# Patient Record
Sex: Female | Born: 1950
Health system: Southern US, Community
[De-identification: ages and names within clinical notes are randomized; demographics above are authoritative.]

## PROBLEM LIST (undated history)

## (undated) DIAGNOSIS — M1990 Unspecified osteoarthritis, unspecified site: Secondary | ICD-10-CM

## (undated) DIAGNOSIS — G894 Chronic pain syndrome: Secondary | ICD-10-CM

## (undated) DIAGNOSIS — G2581 Restless legs syndrome: Secondary | ICD-10-CM

## (undated) DIAGNOSIS — G4730 Sleep apnea, unspecified: Secondary | ICD-10-CM

## (undated) DIAGNOSIS — J9610 Chronic respiratory failure, unspecified whether with hypoxia or hypercapnia: Secondary | ICD-10-CM

## (undated) DIAGNOSIS — D649 Anemia, unspecified: Secondary | ICD-10-CM

## (undated) DIAGNOSIS — M8580 Other specified disorders of bone density and structure, unspecified site: Secondary | ICD-10-CM

## (undated) DIAGNOSIS — M8700 Idiopathic aseptic necrosis of unspecified bone: Secondary | ICD-10-CM

## (undated) DIAGNOSIS — J45909 Unspecified asthma, uncomplicated: Secondary | ICD-10-CM

## (undated) DIAGNOSIS — S22080A Wedge compression fracture of T11-T12 vertebra, initial encounter for closed fracture: Secondary | ICD-10-CM

## (undated) DIAGNOSIS — I10 Essential (primary) hypertension: Secondary | ICD-10-CM

## (undated) DIAGNOSIS — F4310 Post-traumatic stress disorder, unspecified: Secondary | ICD-10-CM

## (undated) DIAGNOSIS — E039 Hypothyroidism, unspecified: Secondary | ICD-10-CM

## (undated) DIAGNOSIS — C801 Malignant (primary) neoplasm, unspecified: Secondary | ICD-10-CM

## (undated) HISTORY — PX: SPINAL CORD STIMULATOR IMPLANT: SHX2422

## (undated) HISTORY — PX: FRACTURE SURGERY: SHX138

## (undated) HISTORY — PX: ABDOMINAL HYSTERECTOMY: SHX81

## (undated) HISTORY — PX: TONSILLECTOMY: SUR1361

## (undated) HISTORY — PX: BLADDER REPAIR: SHX76

## (undated) HISTORY — PX: CATARACT EXTRACTION, BILATERAL: SHX1313

## (undated) HISTORY — PX: BREAST SURGERY: SHX581

## (undated) HISTORY — PX: VENOUS ABLATION: SHX2656

## (undated) HISTORY — PX: APPENDECTOMY: SHX54

---

## 2009-02-12 HISTORY — PX: ANKLE FUSION: SHX881

## 2013-08-15 HISTORY — PX: WRIST SURGERY: SHX841

## 2017-01-11 DIAGNOSIS — Z9689 Presence of other specified functional implants: Secondary | ICD-10-CM | POA: Insufficient documentation

## 2017-01-11 DIAGNOSIS — M8700 Idiopathic aseptic necrosis of unspecified bone: Secondary | ICD-10-CM | POA: Insufficient documentation

## 2017-01-11 DIAGNOSIS — S92902S Unspecified fracture of left foot, sequela: Secondary | ICD-10-CM | POA: Insufficient documentation

## 2017-01-11 DIAGNOSIS — M26629 Arthralgia of temporomandibular joint, unspecified side: Secondary | ICD-10-CM | POA: Insufficient documentation

## 2017-01-11 DIAGNOSIS — N6002 Solitary cyst of left breast: Secondary | ICD-10-CM | POA: Insufficient documentation

## 2017-01-11 DIAGNOSIS — T8543XA Leakage of breast prosthesis and implant, initial encounter: Secondary | ICD-10-CM | POA: Insufficient documentation

## 2017-01-11 DIAGNOSIS — F4310 Post-traumatic stress disorder, unspecified: Secondary | ICD-10-CM | POA: Diagnosis present

## 2017-01-11 DIAGNOSIS — M810 Age-related osteoporosis without current pathological fracture: Secondary | ICD-10-CM | POA: Insufficient documentation

## 2019-01-04 ENCOUNTER — Encounter (HOSPITAL_COMMUNITY): Payer: Self-pay | Admitting: Emergency Medicine

## 2019-01-04 ENCOUNTER — Emergency Department (HOSPITAL_COMMUNITY)
Admission: EM | Admit: 2019-01-04 | Discharge: 2019-01-04 | Disposition: A | Discharge status: Home / Self Care | Payer: Medicare PPO | Attending: Emergency Medicine | Admitting: Emergency Medicine

## 2019-01-04 ENCOUNTER — Other Ambulatory Visit: Payer: Self-pay

## 2019-01-04 ENCOUNTER — Emergency Department (HOSPITAL_COMMUNITY): Payer: Medicare PPO

## 2019-01-04 DIAGNOSIS — Y9389 Activity, other specified: Secondary | ICD-10-CM | POA: Insufficient documentation

## 2019-01-04 DIAGNOSIS — E876 Hypokalemia: Secondary | ICD-10-CM

## 2019-01-04 DIAGNOSIS — Y999 Unspecified external cause status: Secondary | ICD-10-CM | POA: Insufficient documentation

## 2019-01-04 DIAGNOSIS — Y929 Unspecified place or not applicable: Secondary | ICD-10-CM | POA: Insufficient documentation

## 2019-01-04 DIAGNOSIS — S22080A Wedge compression fracture of T11-T12 vertebra, initial encounter for closed fracture: Secondary | ICD-10-CM | POA: Insufficient documentation

## 2019-01-04 DIAGNOSIS — S299XXA Unspecified injury of thorax, initial encounter: Secondary | ICD-10-CM | POA: Diagnosis present

## 2019-01-04 DIAGNOSIS — X500XXA Overexertion from strenuous movement or load, initial encounter: Secondary | ICD-10-CM | POA: Diagnosis not present

## 2019-01-04 HISTORY — DX: Chronic respiratory failure, unspecified whether with hypoxia or hypercapnia: J96.10

## 2019-01-04 LAB — BASIC METABOLIC PANEL
Anion gap: 10 (ref 5–15)
BUN: 10 mg/dL (ref 8–23)
CALCIUM: 9.2 mg/dL (ref 8.9–10.3)
CO2: 24 mmol/L (ref 22–32)
Chloride: 106 mmol/L (ref 98–111)
Creatinine, Ser: 0.76 mg/dL (ref 0.44–1.00)
GFR calc Af Amer: >60 mL/min (ref >60)
GFR calc non Af Amer: >60 mL/min (ref >60)
Glucose, Bld: 186 mg/dL — ABNORMAL HIGH (ref 70–99)
Potassium: 3.1 mmol/L — ABNORMAL LOW (ref 3.5–5.1)
Sodium: 140 mmol/L (ref 135–145)

## 2019-01-04 LAB — URINALYSIS, ROUTINE W REFLEX MICROSCOPIC
Bacteria, UA: NONE SEEN
Bilirubin Urine: NEGATIVE
Glucose, UA: NEGATIVE mg/dL
Ketones, ur: NEGATIVE mg/dL
LEUKOCYTE UA: NEGATIVE
Nitrite: NEGATIVE
Protein, ur: NEGATIVE mg/dL
Specific Gravity, Urine: 1.004 — ABNORMAL LOW (ref 1.005–1.030)
pH: 6 (ref 5.0–8.0)

## 2019-01-04 MED ORDER — POTASSIUM CHLORIDE CRYS ER 20 MEQ PO TBCR: 20.0000 meq | EXTENDED_RELEASE_TABLET | Freq: Two times a day (BID) | ORAL | 0 refills | Status: DC

## 2019-01-04 MED ORDER — HYDROMORPHONE HCL 1 MG/ML IJ SOLN
1.0000 mg | Freq: Once | INTRAMUSCULAR | Status: AC
  Administered 2019-01-04: 1 mg via INTRAMUSCULAR
  Filled 2019-01-04: qty 1

## 2019-01-04 MED ORDER — HYDROMORPHONE HCL 1 MG/ML IJ SOLN
0.5000 mg | Freq: Once | INTRAMUSCULAR | Status: AC
  Administered 2019-01-04: 0.5 mg via INTRAVENOUS
  Filled 2019-01-04: qty 1

## 2019-01-04 MED ORDER — OXYCODONE-ACETAMINOPHEN 5-325 MG PO TABS: 2.0000 | ORAL_TABLET | ORAL | 0 refills | Status: DC | PRN

## 2019-01-04 MED ORDER — OXYCODONE-ACETAMINOPHEN 5-325 MG PO TABS: 1.0000 | ORAL_TABLET | ORAL | 0 refills | Status: DC | PRN

## 2019-01-04 MED ORDER — METHOCARBAMOL 500 MG PO TABS: 500.0000 mg | ORAL_TABLET | Freq: Three times a day (TID) | ORAL | 0 refills | Status: DC

## 2019-01-04 NOTE — ED Provider Notes (Signed)
[facility] EMERGENCY DEPARTMENT Provider Note   CSN: [id number] Arrival date & time: 01/04/19  1702    History   Chief Complaint Chief Complaint  Patient presents with  . Back Pain    HPI Melissa Burke is a 68 y.o. female.     HPI   Melissa Burke is a 68 y.o. female with a hx of chronic low back pain and has an implanted nerve stimulator device, presents to the Emergency Department complaining of increased low back pain after lifting a 40 pound horse saddle and then twisting to sit it down.  Incident occurred yesterday and since then , she has developed increasing pain across her lower back that she describes a constant and pain does not radiate into her buttocks or legs.  She states her stimulator is not controlling her pain.  She denies fall, pain, numbness or weakness in to her lower extremities, urine or bowel changes, fever, chills, and abdominal pain.  No recent travel or sick contacts.  She is also requesting referral info for pain management.     Past Medical History:  Diagnosis Date  . Chronic respiratory failure (HCC)     There are no active problems to display for this patient.   Past Surgical History:  Procedure Laterality Date  . ABDOMINAL HYSTERECTOMY    . BREAST SURGERY    . FRACTURE SURGERY       OB History    Gravida  3   Para  2   Term  2   Preterm      AB  1   Living        SAB  1   TAB      Ectopic      Multiple      Live Births               Home Medications    Prior to Admission medications   Not on File    Family History Family History  Problem Relation Age of Onset  . Emphysema Mother   . Cancer Sister   . Heart attack Other   . Colon cancer Other     Social History Social History   Tobacco Use  . Smoking status: Never Smoker  . Smokeless tobacco: Never Used  Substance Use Topics  . Alcohol use: Never    Frequency: Never  . Drug use: Never     Allergies   Erythromycin and Other    Review of Systems Review of Systems  Constitutional: Negative for appetite change, chills and fever.  Respiratory: Negative for cough and shortness of breath.   Cardiovascular: Negative for chest pain.  Gastrointestinal: Negative for abdominal pain, constipation and vomiting.  Genitourinary: Negative for difficulty urinating, dysuria and flank pain.  Musculoskeletal: Positive for back pain. Negative for joint swelling and neck pain.  Skin: Negative for rash.  Neurological: Negative for weakness and numbness.     Physical Exam Updated Vital Signs BP (!) 153/76 (BP Location: Right Arm)   Pulse 94   Temp 97.9 F (36.6 C) (Oral)   Resp 18   Ht 5\' 6"  (1.676 m)   Wt 81.6 kg   SpO2 96%   BMI 29.05 kg/m   Physical Exam Vitals signs and nursing note reviewed.  Constitutional:      Appearance: Normal appearance. She is not ill-appearing or toxic-appearing.  HENT:     Head: Atraumatic.  Cardiovascular:     Rate and Rhythm: Normal rate and regular rhythm.  Pulses: Normal pulses.  Pulmonary:     Effort: Pulmonary effort is normal.     Breath sounds: Normal breath sounds.  Chest:     Chest wall: No tenderness.  Abdominal:     General: There is no distension.     Palpations: Abdomen is soft.     Tenderness: There is no abdominal tenderness. There is no guarding.  Musculoskeletal:        General: Tenderness present. No swelling, deformity or signs of injury.     Comments: Diffuse ttp of the lumbar spine and bilateral lumbar paraspinal muscles.  No bony step offs.  Pain reproduced on bilateral SLRs.  5/5 motor strength of the LE's.  Hip flexors and extensors intact  Skin:    General: Skin is warm.     Capillary Refill: Capillary refill takes less than 2 seconds.  Neurological:     General: No focal deficit present.     Mental Status: She is alert.     Sensory: No sensory deficit.     Motor: No weakness.      ED Treatments / Results  Labs (all labs ordered are listed,  but only abnormal results are displayed) Labs Reviewed  URINALYSIS, ROUTINE W REFLEX MICROSCOPIC - Abnormal; Notable for the following components:      Result Value   Color, Urine STRAW (*)    Specific Gravity, Urine 1.004 (*)    Hgb urine dipstick SMALL (*)    All other components within normal limits  BASIC METABOLIC PANEL - Abnormal; Notable for the following components:   Potassium 3.1 (*)    Glucose, Bld 186 (*)    All other components within normal limits    EKG None  Radiology Dg Lumbar Spine Complete  Result Date: 01/04/2019 CLINICAL DATA:  Back pain after lifting injury. EXAM: LUMBAR SPINE - COMPLETE 4+ VIEW COMPARISON:  None. FINDINGS: Bones are demineralized. T12 superior endplate compression fracture appears acute by x-ray. No definite posterior bony retropulsion. Fracture results in less than 25% loss of height anteriorly and no perceptible loss of height posteriorly. No evidence for lumbar spine fracture. Mild loss of intervertebral disc height evident at L2-3. SI joints unremarkable. IMPRESSION: T12 superior endplate compression fracture, likely acute based on x-ray appearance. Electronically Signed   By: Kennith CenterEric  Mansell M.D.   On: 01/04/2019 18:05    Procedures Procedures (including critical care time)  Medications Ordered in ED Medications  HYDROmorphone (DILAUDID) injection 1 mg (has no administration in time range)     Initial Impression / Assessment and Plan / ED Course  I have reviewed the triage vital signs and the nursing notes.  Pertinent labs & imaging results that were available during my care of the patient were reviewed by me and considered in my medical decision making (see chart for details).        1930  On recheck, pt reports feeling better.  Remains NV intact.  No motor weakness.  Pt able to ambulate in the dept with steady gait.  Discussed XR findings, [place] and agrees to close f/u with PCP there. She will likely need  bracing and she appears stable to arrange this with her PCP.  She is requesting referral info for neurosurgery locally in [place].  Labs show mild hypokalemia, will provide short course of potassium and she agrees to PCP recheck in 1 week.  Appears appropriate for d/c home. Discussed care plan with Dr. Ranae PalmsYelverton.  Referral info given for neurosurgery.  Strict return precautions discussed.    Final Clinical Impressions(s) / ED Diagnoses   Final diagnoses:  Compression fracture of T12 vertebra, initial encounter [facility])  Hypokalemia    ED Discharge Orders    None       Rosey Bath 01/04/19 2104    Loren Racer, MD 01/04/19 [phone]

## 2019-01-04 NOTE — Discharge Instructions (Addendum)
Your XR today shows a compression fracture of your T12 vertebra.  You will likely need a brace for your back.  Your primary provider should be able to arrange this for you.  I have also listed a provider with the neurosurgery office in [place] that you may contact.  No lifting over 5 pounds, bending or twisting.

## 2019-01-04 NOTE — ED Triage Notes (Signed)
Patient picked up a 40 lb saddle yesterday and injured her back, has implanted stimulator from prior problems with her back. Lower lumbar pain bilaterally. Pt unable to lie on her back. No radiation down her legs.

## 2019-01-05 MED FILL — Oxycodone w/ Acetaminophen Tab 5-325 MG: ORAL | Qty: 6 | Status: AC

## 2019-01-06 ENCOUNTER — Other Ambulatory Visit: Payer: Self-pay

## 2019-01-06 ENCOUNTER — Encounter (HOSPITAL_COMMUNITY): Payer: Self-pay | Admitting: Emergency Medicine

## 2019-01-06 ENCOUNTER — Emergency Department (HOSPITAL_COMMUNITY)
Admission: EM | Admit: 2019-01-06 | Discharge: 2019-01-06 | Disposition: A | Discharge status: Home / Self Care | Payer: Medicare PPO | Attending: Emergency Medicine | Admitting: Emergency Medicine

## 2019-01-06 DIAGNOSIS — W19XXXA Unspecified fall, initial encounter: Secondary | ICD-10-CM | POA: Diagnosis not present

## 2019-01-06 DIAGNOSIS — M545 Low back pain: Secondary | ICD-10-CM | POA: Diagnosis present

## 2019-01-06 DIAGNOSIS — Z79899 Other long term (current) drug therapy: Secondary | ICD-10-CM | POA: Insufficient documentation

## 2019-01-06 DIAGNOSIS — S22000A Wedge compression fracture of unspecified thoracic vertebra, initial encounter for closed fracture: Secondary | ICD-10-CM

## 2019-01-06 DIAGNOSIS — S22000D Wedge compression fracture of unspecified thoracic vertebra, subsequent encounter for fracture with routine healing: Secondary | ICD-10-CM | POA: Diagnosis not present

## 2019-01-06 MED ORDER — KETOROLAC TROMETHAMINE 30 MG/ML IJ SOLN
30.0000 mg | Freq: Once | INTRAMUSCULAR | Status: AC
  Administered 2019-01-06: 30 mg via INTRAMUSCULAR
  Filled 2019-01-06: qty 1

## 2019-01-06 MED ORDER — ONDANSETRON HCL 4 MG PO TABS
4.0000 mg | ORAL_TABLET | Freq: Once | ORAL | Status: AC
  Administered 2019-01-06: 4 mg via ORAL
  Filled 2019-01-06: qty 1

## 2019-01-06 MED ORDER — HYDROMORPHONE HCL 2 MG/ML IJ SOLN
1.5000 mg | Freq: Once | INTRAMUSCULAR | Status: AC
  Administered 2019-01-06: 1.5 mg via INTRAMUSCULAR
  Filled 2019-01-06: qty 1

## 2019-01-06 MED ORDER — CYCLOBENZAPRINE HCL 10 MG PO TABS: 10.0000 mg | ORAL_TABLET | Freq: Three times a day (TID) | ORAL | 0 refills | Status: DC

## 2019-01-06 MED ORDER — DIAZEPAM 5 MG PO TABS
5.0000 mg | ORAL_TABLET | Freq: Once | ORAL | Status: AC
  Administered 2019-01-06: 5 mg via ORAL
  Filled 2019-01-06: qty 1

## 2019-01-06 MED ORDER — MORPHINE SULFATE (PF) 10 MG/ML IV SOLN: 10.0000 mg | Freq: Once | INTRAVENOUS | Status: DC

## 2019-01-06 NOTE — ED Notes (Signed)
ED Provider at bedside. 

## 2019-01-06 NOTE — Discharge Instructions (Addendum)
Your neurologic examination is negative for acute deficits.  Your history and examination favor exacerbation of your compression fracture.  Please use the prescription for the Norco from Dr.Syed.  Please change her muscle relaxer to Flexeril 3 times daily.  You were treated tonight with intramuscular narcotic medication.  Please use caution getting around.  Heating pad to the lower back area may be helpful.

## 2019-01-06 NOTE — ED Triage Notes (Signed)
Pt was here for back pain on 3/22, given percocets to take at home. No relief.  States " I need more pain meds and something stronger"

## 2019-01-06 NOTE — ED Provider Notes (Signed)
[facility] EMERGENCY DEPARTMENT Provider Note   CSN: [id number] Arrival date & time: 01/06/19  1648    History   Chief Complaint Chief Complaint  Patient presents with  . Back Pain    HPI Melissa Burke is a 68 y.o. female.     Patient is a 68 year old female who presents to the emergency department with a complaint of lower back pain.  The patient states that she sustained a fall.  She was seen in the emergency department on March 22.  It was determined that she had a compression fracture of the lower thoracic area.  The patient was treated with narcotic pain medication and muscle relaxers.  The patient states that she gets very little relief from the Percocet that she was ordered, it also makes her itch.  She says today the pain seemed to be worse.  She says she has been unable to find a comfortable position during most of the day today.  She presents to the emergency department requesting additional pain medication assistance with her discomfort.  The patient denies any loss of bowel or bladder function.  There is been no loss of control of the lower extremities.  There is been no numbness in the saddle area according to the patient.  The history is provided by the patient.  Back Pain  Associated symptoms: no abdominal pain, no chest pain and no dysuria     Past Medical History:  Diagnosis Date  . Chronic respiratory failure (HCC)     There are no active problems to display for this patient.   Past Surgical History:  Procedure Laterality Date  . ABDOMINAL HYSTERECTOMY    . BREAST SURGERY    . FRACTURE SURGERY       OB History    Gravida  3   Para  2   Term  2   Preterm      AB  1   Living        SAB  1   TAB      Ectopic      Multiple      Live Births               Home Medications    Prior to Admission medications   Medication Sig Start Date End Date Taking? Authorizing Provider  amLODipine (NORVASC) 10 MG tablet Take 1 tablet by  mouth daily. 10/27/18   [provider]  azithromycin (ZITHROMAX) 250 MG tablet Take 1 tablet by mouth daily. Takes on Thursdays, Wednesdays and Fridays 11/03/18   [provider]  celecoxib (CELEBREX) 200 MG capsule Take 1 capsule by mouth at bedtime. 11/07/16   [provider]  DULoxetine (CYMBALTA) 60 MG capsule Take 1 capsule by mouth 2 (two) times daily. 12/20/16   [provider]  Ipratropium-Albuterol (COMBIVENT RESPIMAT) 20-100 MCG/ACT AERS respimat Take 1 puff by mouth 4 (four) times daily. 12/05/16   [provider]  levothyroxine (SYNTHROID, LEVOTHROID) 100 MCG tablet Take 1 tablet by mouth daily. 12/21/16   [provider]  losartan (COZAAR) 25 MG tablet Take 1 tablet by mouth daily. 11/03/18   [provider]  methocarbamol (ROBAXIN) 500 MG tablet Take 1 tablet (500 mg total) by mouth 3 (three) times daily. 01/04/19   Triplett, Tammy, PA-C  montelukast (SINGULAIR) 10 MG tablet Take 1 tablet by mouth at bedtime. 12/21/16   [provider]  Multiple Vitamins-Minerals (MULTIVITAMIN ADULT) TABS Take 1 tablet by mouth daily.  [provider]  naproxen (NAPROSYN) 250 MG tablet Take 250 mg by mouth 2 (two) times daily with a meal.    [provider]  oxyCODONE-acetaminophen (PERCOCET/ROXICET) 5-325 MG tablet Take 2 tablets by mouth every 4 (four) hours as needed for severe pain. 01/04/19   Triplett, Tammy, PA-C  oxyCODONE-acetaminophen (PERCOCET/ROXICET) 5-325 MG tablet Take 1 tablet by mouth every 4 (four) hours as needed. 01/04/19   Triplett, Tammy, PA-C  potassium chloride SA (K-DUR,KLOR-CON) 20 MEQ tablet Take 1 tablet (20 mEq total) by mouth 2 (two) times daily. 01/04/19   Triplett, Tammy, PA-C  rOPINIRole (REQUIP) 2 MG tablet Take 1 tablet by mouth at bedtime. 12/20/16   [provider]  traZODone (DESYREL) 100 MG tablet Take 3 tablets by mouth at bedtime. 12/20/16   [provider]  vitamin B-12  (CYANOCOBALAMIN) 1000 MCG tablet Take 1,000 mcg by mouth daily.    [provider]  Vitamin D, Ergocalciferol, (DRISDOL) 1.25 MG (50000 UT) CAPS capsule Take 50,000 Units by mouth every 7 (seven) days.    [provider]    Family History Family History  Problem Relation Age of Onset  . Emphysema Mother   . Cancer Sister   . Heart attack Other   . Colon cancer Other     Social History Social History   Tobacco Use  . Smoking status: Never Smoker  . Smokeless tobacco: Never Used  Substance Use Topics  . Alcohol use: Never    Frequency: Never  . Drug use: Never     Allergies   Erythromycin and Other   Review of Systems Review of Systems  Constitutional: Negative for activity change.       All ROS Neg except as noted in HPI  HENT: Negative for nosebleeds.   Eyes: Negative for photophobia and discharge.  Respiratory: Negative for cough, shortness of breath and wheezing.   Cardiovascular: Negative for chest pain and palpitations.  Gastrointestinal: Negative for abdominal pain and blood in stool.  Genitourinary: Negative for dysuria, frequency and hematuria.  Musculoskeletal: Positive for back pain. Negative for arthralgias and neck pain.  Skin: Negative.   Neurological: Negative for dizziness, seizures and speech difficulty.  Psychiatric/Behavioral: Negative for confusion and hallucinations. The patient is nervous/anxious.      Physical Exam Updated Vital Signs BP (!) 121/107 (BP Location: Right Arm)   Pulse (!) 107   Temp 98 F (36.7 C) (Oral)   Resp 18   Ht 5\' 6"  (1.676 m)   Wt 81.6 kg   SpO2 98%   BMI 29.05 kg/m   Physical Exam Vitals signs and nursing note reviewed.  Constitutional:      Appearance: She is well-developed. She is not toxic-appearing.  HENT:     Head: Normocephalic.     Right Ear: Tympanic membrane and external ear normal.     Left Ear: Tympanic membrane and external ear normal.  Eyes:     General: Lids are normal.      Pupils: Pupils are equal, round, and reactive to light.  Neck:     Musculoskeletal: Normal range of motion and neck supple.     Vascular: No carotid bruit.  Cardiovascular:     Rate and Rhythm: Normal rate and regular rhythm.     Pulses: Normal pulses.     Heart sounds: Normal heart sounds.  Pulmonary:     Effort: No respiratory distress.     Breath sounds: Normal breath sounds.  Abdominal:  General: Bowel sounds are normal.     Palpations: Abdomen is soft.     Tenderness: There is no abdominal tenderness. There is no guarding.  Musculoskeletal: Normal range of motion.     Thoracic back: She exhibits pain.  Lymphadenopathy:     Head:     Right side of head: No submandibular adenopathy.     Left side of head: No submandibular adenopathy.     Cervical: No cervical adenopathy.  Skin:    General: Skin is warm and dry.  Neurological:     Mental Status: She is alert and oriented to person, place, and time.     Cranial Nerves: No cranial nerve deficit.     Sensory: No sensory deficit.  Psychiatric:        Speech: Speech normal.      ED Treatments / Results  Labs (all labs ordered are listed, but only abnormal results are displayed) Labs Reviewed - No data to display  EKG None  Radiology Dg Lumbar Spine Complete  Result Date: 01/04/2019 CLINICAL DATA:  Back pain after lifting injury. EXAM: LUMBAR SPINE - COMPLETE 4+ VIEW COMPARISON:  None. FINDINGS: Bones are demineralized. T12 superior endplate compression fracture appears acute by x-ray. No definite posterior bony retropulsion. Fracture results in less than 25% loss of height anteriorly and no perceptible loss of height posteriorly. No evidence for lumbar spine fracture. Mild loss of intervertebral disc height evident at L2-3. SI joints unremarkable. IMPRESSION: T12 superior endplate compression fracture, likely acute based on x-ray appearance. Electronically Signed   By: Kennith Center M.D.   On: 01/04/2019 18:05     Procedures Procedures (including critical care time)  Medications Ordered in ED Medications  diazepam (VALIUM) tablet 5 mg (has no administration in time range)  ondansetron (ZOFRAN) tablet 4 mg (has no administration in time range)  ketorolac (TORADOL) 30 MG/ML injection 30 mg (has no administration in time range)  HYDROmorphone (DILAUDID) injection 1.5 mg (has no administration in time range)     Initial Impression / Assessment and Plan / ED Course  I have reviewed the triage vital signs and the nursing notes.  Pertinent labs & imaging results that were available during my care of the patient were reviewed by me and considered in my medical decision making (see chart for details).          Final Clinical Impressions(s) / ED Diagnoses MDM  Pulse rate elevated.  Blood pressure elevated at 121/107.  Pulse oximetry is within normal limits at 98% on room air.  The patient has been diagnosed with compression fracture.  She was seen here in the emergency department on 3/22. I have reviewed the x-rays of March 22.  The patient has endplate compression fracture at the T12 area.  There is some loss of height of the disc space at the lumbar L to L3.  There are multiple areas of degenerative joint disease noted.  No gross neurologic deficit appreciated.  In particular there is no evidence of cauda equina or other emergent changes.  The patient is amatory without major problem.  The patient is requesting medication for pain.  She says that the Percocet that she had been given makes her itch.  She has been in contact with her primary physician who has ordered her Norco.  The patient states she has had Norco in the past and did not have problems with that.  The plan at this time is to change the patient's muscle relaxer to Flexeril.  She is to get her medication filled on tomorrow for the Norco that was ordered by her primary physician.  I have asked the patient to rest her back is much as  possible.  And to see the specialist as previously arranged.  The patient is to see the primary physician or return to the emergency department if any worsening of her symptoms, worsening of her condition, problems, or concerns.   Final diagnoses:  Compression fracture of body of thoracic vertebra [facility])    ED Discharge Orders         Ordered    cyclobenzaprine (FLEXERIL) 10 MG tablet  3 times daily     01/06/19 1801           Ivery Quale, PA-C 01/06/19 1835    Mancel Bale, MD 01/08/19 [phone]

## 2019-01-06 NOTE — ED Notes (Signed)
Pt ambulated out of room to nurses desk requesting pain medication, EDP made aware.

## 2019-02-19 ENCOUNTER — Other Ambulatory Visit: Payer: Self-pay | Admitting: Student

## 2019-02-19 DIAGNOSIS — S22080A Wedge compression fracture of T11-T12 vertebra, initial encounter for closed fracture: Secondary | ICD-10-CM

## 2019-02-25 ENCOUNTER — Ambulatory Visit
Admission: RE | Admit: 2019-02-25 | Discharge: 2019-02-25 | Disposition: A | Discharge status: Home / Self Care | Payer: Medicare PPO | Source: Ambulatory Visit | Attending: Student | Admitting: Student

## 2019-02-25 DIAGNOSIS — S22080A Wedge compression fracture of T11-T12 vertebra, initial encounter for closed fracture: Secondary | ICD-10-CM

## 2019-03-17 ENCOUNTER — Other Ambulatory Visit (HOSPITAL_COMMUNITY): Payer: Self-pay | Admitting: Interventional Radiology

## 2019-03-17 DIAGNOSIS — S22080A Wedge compression fracture of T11-T12 vertebra, initial encounter for closed fracture: Secondary | ICD-10-CM

## 2019-03-18 ENCOUNTER — Other Ambulatory Visit: Payer: Self-pay | Admitting: Radiology

## 2019-03-18 ENCOUNTER — Other Ambulatory Visit: Payer: Self-pay | Admitting: Student

## 2019-03-19 ENCOUNTER — Other Ambulatory Visit (HOSPITAL_COMMUNITY): Payer: Self-pay | Admitting: Interventional Radiology

## 2019-03-19 ENCOUNTER — Ambulatory Visit (HOSPITAL_COMMUNITY)
Admission: RE | Admit: 2019-03-19 | Discharge: 2019-03-19 | Disposition: A | Discharge status: Home / Self Care | Payer: Medicare Other | Source: Ambulatory Visit | Attending: Interventional Radiology | Admitting: Interventional Radiology

## 2019-03-19 ENCOUNTER — Encounter (HOSPITAL_COMMUNITY): Payer: Self-pay

## 2019-03-19 ENCOUNTER — Other Ambulatory Visit: Payer: Self-pay

## 2019-03-19 DIAGNOSIS — Z79899 Other long term (current) drug therapy: Secondary | ICD-10-CM | POA: Diagnosis not present

## 2019-03-19 DIAGNOSIS — Z90710 Acquired absence of both cervix and uterus: Secondary | ICD-10-CM | POA: Insufficient documentation

## 2019-03-19 DIAGNOSIS — Z8249 Family history of ischemic heart disease and other diseases of the circulatory system: Secondary | ICD-10-CM | POA: Insufficient documentation

## 2019-03-19 DIAGNOSIS — Z881 Allergy status to other antibiotic agents status: Secondary | ICD-10-CM | POA: Insufficient documentation

## 2019-03-19 DIAGNOSIS — Z79890 Hormone replacement therapy (postmenopausal): Secondary | ICD-10-CM | POA: Insufficient documentation

## 2019-03-19 DIAGNOSIS — S22080A Wedge compression fracture of T11-T12 vertebra, initial encounter for closed fracture: Secondary | ICD-10-CM | POA: Diagnosis present

## 2019-03-19 HISTORY — PX: IR VERTEBROPLASTY CERV/THOR BX INC UNI/BIL INC/INJECT/IMAGING: IMG5515

## 2019-03-19 LAB — BASIC METABOLIC PANEL
Anion gap: 10 (ref 5–15)
BUN: 10 mg/dL (ref 8–23)
CO2: 23 mmol/L (ref 22–32)
Calcium: 9.4 mg/dL (ref 8.9–10.3)
Chloride: 105 mmol/L (ref 98–111)
Creatinine, Ser: 0.93 mg/dL (ref 0.44–1.00)
GFR calc Af Amer: >60 mL/min (ref >60)
GFR calc non Af Amer: >60 mL/min (ref >60)
Glucose, Bld: 114 mg/dL — ABNORMAL HIGH (ref 70–99)
Potassium: 3.9 mmol/L (ref 3.5–5.1)
Sodium: 138 mmol/L (ref 135–145)

## 2019-03-19 LAB — URINALYSIS, ROUTINE W REFLEX MICROSCOPIC
Bilirubin Urine: NEGATIVE
Glucose, UA: NEGATIVE mg/dL
Ketones, ur: NEGATIVE mg/dL
Nitrite: NEGATIVE
Protein, ur: NEGATIVE mg/dL
Specific Gravity, Urine: 1.014 (ref 1.005–1.030)
pH: 5 (ref 5.0–8.0)

## 2019-03-19 LAB — CBC WITH DIFFERENTIAL/PLATELET
Abs Immature Granulocytes: 0.02 10*3/uL (ref 0.00–0.07)
Basophils Absolute: 0.1 10*3/uL (ref 0.0–0.1)
Basophils Relative: 1 %
Eosinophils Absolute: 0.5 10*3/uL (ref 0.0–0.5)
Eosinophils Relative: 8 %
HCT: 38.4 % (ref 36.0–46.0)
Hemoglobin: 12.7 g/dL (ref 12.0–15.0)
Immature Granulocytes: 0 %
Lymphocytes Relative: 19 %
Lymphs Abs: 1.2 10*3/uL (ref 0.7–4.0)
MCH: 33 pg (ref 26.0–34.0)
MCHC: 33.1 g/dL (ref 30.0–36.0)
MCV: 99.7 fL (ref 80.0–100.0)
Monocytes Absolute: 0.7 10*3/uL (ref 0.1–1.0)
Monocytes Relative: 11 %
Neutro Abs: 3.7 10*3/uL (ref 1.7–7.7)
Neutrophils Relative %: 61 %
Platelets: 165 10*3/uL (ref 150–400)
RBC: 3.85 MIL/uL — ABNORMAL LOW (ref 3.87–5.11)
RDW: 13.3 % (ref 11.5–15.5)
WBC: 6.1 10*3/uL (ref 4.0–10.5)
nRBC: 0 % (ref 0.0–0.2)

## 2019-03-19 LAB — PROTIME-INR
INR: 0.9 (ref 0.8–1.2)
Prothrombin Time: 12.5 seconds (ref 11.4–15.2)

## 2019-03-19 MED ORDER — OXYCODONE-ACETAMINOPHEN 5-325 MG PO TABS
1.0000 | ORAL_TABLET | Freq: Once | ORAL | Status: AC
  Administered 2019-03-19: 1 via ORAL

## 2019-03-19 MED ORDER — CEFAZOLIN SODIUM-DEXTROSE 2-4 GM/100ML-% IV SOLN
INTRAVENOUS | Status: AC
  Filled 2019-03-19: qty 100

## 2019-03-19 MED ORDER — BUPIVACAINE HCL (PF) 0.5 % IJ SOLN
INTRAMUSCULAR | Status: AC
  Filled 2019-03-19: qty 30

## 2019-03-19 MED ORDER — FENTANYL CITRATE (PF) 100 MCG/2ML IJ SOLN
INTRAMUSCULAR | Status: AC | PRN
  Administered 2019-03-19 (×3): 25 ug via INTRAVENOUS

## 2019-03-19 MED ORDER — TOBRAMYCIN SULFATE 1.2 G IJ SOLR
INTRAMUSCULAR | Status: AC
  Administered 2019-03-19: 12:00:00 0.1 g
  Filled 2019-03-19: qty 1.2

## 2019-03-19 MED ORDER — MIDAZOLAM HCL 2 MG/2ML IJ SOLN
INTRAMUSCULAR | Status: AC | PRN
  Administered 2019-03-19 (×2): 1 mg via INTRAVENOUS

## 2019-03-19 MED ORDER — SODIUM CHLORIDE 0.9 % IV SOLN: INTRAVENOUS | Status: AC

## 2019-03-19 MED ORDER — HYDROMORPHONE HCL 1 MG/ML IJ SOLN
INTRAMUSCULAR | Status: AC | PRN
  Administered 2019-03-19: 1 mg via INTRAVENOUS

## 2019-03-19 MED ORDER — MIDAZOLAM HCL 2 MG/2ML IJ SOLN
INTRAMUSCULAR | Status: AC
  Filled 2019-03-19: qty 2

## 2019-03-19 MED ORDER — SODIUM CHLORIDE 0.9 % IV SOLN: INTRAVENOUS | Status: DC

## 2019-03-19 MED ORDER — CEFAZOLIN SODIUM-DEXTROSE 2-4 GM/100ML-% IV SOLN
2.0000 g | INTRAVENOUS | Status: AC
  Administered 2019-03-19: 11:00:00 2 g via INTRAVENOUS

## 2019-03-19 MED ORDER — IOHEXOL 300 MG/ML  SOLN
50.0000 mL | Freq: Once | INTRAMUSCULAR | Status: AC | PRN
  Administered 2019-03-19: 5 mL via INTRATHECAL

## 2019-03-19 MED ORDER — OXYCODONE-ACETAMINOPHEN 5-325 MG PO TABS
ORAL_TABLET | ORAL | Status: AC
  Administered 2019-03-19: 1 via ORAL
  Filled 2019-03-19: qty 1

## 2019-03-19 MED ORDER — HYDROMORPHONE HCL 1 MG/ML IJ SOLN
INTRAMUSCULAR | Status: AC
  Filled 2019-03-19: qty 1

## 2019-03-19 MED ORDER — BUPIVACAINE HCL (PF) 0.25 % IJ SOLN
INTRAMUSCULAR | Status: AC | PRN
  Administered 2019-03-19: 20 mL

## 2019-03-19 MED ORDER — FENTANYL CITRATE (PF) 100 MCG/2ML IJ SOLN
INTRAMUSCULAR | Status: AC
  Filled 2019-03-19: qty 2

## 2019-03-19 NOTE — Discharge Instructions (Signed)
Balloon Kyphoplasty, Care After Refer to this sheet in the next few weeks. These instructions provide you with information about caring for yourself after your procedure. Your health care provider may also give you more specific instructions. Your treatment has been planned according to current medical practices, but problems sometimes occur. Call your health care provider if you have any problems or questions after your procedure. What can I expect after the procedure? After your procedure, it is common to have back pain. Follow these instructions at home: Incision care  Follow instructions from your health care provider about how to take care of your incisions. Make sure you: ? Wash your hands with soap and water before you change your bandage (dressing). If soap and water are not available, use hand sanitizer. Remove dressing tomorrow afternoon and may shower; do not sit in water until sites heal ? Leave stitches (sutures), skin glue, or adhesive strips in place. These skin closures may need to be in place for 2 weeks or longer. If adhesive strip edges start to loosen and curl up, you may trim the loose edges. Do not remove adhesive strips completely unless your health care provider tells you to do that.  Check your incision area every day for signs of infection. Watch for: ? Redness, swelling, or pain. ? Fluid, blood, or pus.  Keep your dressing dry until your health care provider says that it can be removed. Activity   Rest your back and avoid intense physical activity for as long as told by your health care provider.  Return to your normal activities as told by your health care provider. Ask your health care provider what activities are safe for you.  Do not lift anything that is heavier than 10 lb (4.5 kg). This is about the weight of a gallon of milk.You may need to avoid heavy lifting for several weeks. General instructions  Take over-the-counter and prescription medicines only  as told by your health care provider.  If directed, apply ice to the painful area: ? Put ice in a plastic bag. ? Place a towel between your skin and the bag. ? Leave the ice on for 20 minutes, 2-3 times per day.  Do not use tobacco products, including cigarettes, chewing tobacco, or e-cigarettes. If you need help quitting, ask your health care provider.  Keep all follow-up visits as told by your health care provider. This is important. Contact a health care provider if:  You have a fever.  You have redness, swelling, or pain at the site of your incisions.  You have fluid, blood, or pus coming from your incisions.  You have pain that gets worse or does not get better with medicine.  You develop numbness or weakness in any part of your body. Get help right away if:  You have chest pain.  You have difficulty breathing.  You cannot move your legs.  You cannot control your bladder or bowel movements.  You suddenly become weak or numb on one side of your body.  You become very confused.  You have trouble speaking or understanding, or both. This information is not intended to replace advice given to you by your health care provider. Make sure you discuss any questions you have with your health care provider. Document Released: 06/22/2015 Document Revised: 03/08/2016 Document Reviewed: 01/24/2015 Elsevier Interactive Patient Education  2019 [employer]     Moderate Conscious Sedation, Adult, Care After These instructions provide you with information about caring for yourself after your procedure.  Your health care provider may also give you more specific instructions. Your treatment has been planned according to current medical practices, but problems sometimes occur. Call your health care provider if you have any problems or questions after your procedure. What can I expect after the procedure? After your procedure, it is common:  To feel sleepy for several hours.  To  feel clumsy and have poor balance for several hours.  To have poor judgment for several hours.  To vomit if you eat too soon. Follow these instructions at home: For at least 24 hours after the procedure:   Do not: ? Participate in activities where you could fall or become injured. ? Drive. ? Use heavy machinery. ? Drink alcohol. ? Take sleeping pills or medicines that cause drowsiness. ? Make important decisions or sign legal documents. ? Take care of children on your own.  Rest. Eating and drinking  Follow the diet recommended by your health care provider.  If you vomit: ? Drink water, juice, or soup when you can drink without vomiting. ? Make sure you have little or no nausea before eating solid foods. General instructions  Have a responsible adult stay with you until you are awake and alert.  Take over-the-counter and prescription medicines only as told by your health care provider.  If you smoke, do not smoke without supervision.  Keep all follow-up visits as told by your health care provider. This is important. Contact a health care provider if:  You keep feeling nauseous or you keep vomiting.  You feel light-headed.  You develop a rash.  You have a fever. Get help right away if:  You have trouble breathing. This information is not intended to replace advice given to you by your health care provider. Make sure you discuss any questions you have with your health care provider. Document Released: 07/22/2013 Document Revised: 03/05/2016 Document Reviewed: 01/21/2016 Elsevier Interactive Patient Education  2019 [employer].     1.No stooping,bending or lifting more than 10 lbs for 2 weeks. 2.Use walker to ambulate for 2 weeks. 3.Avoid driving for 2 weeks. 4.RTC 2 to 3 weeks PRN

## 2019-03-19 NOTE — Consult Note (Signed)
Chief Complaint: Patient was seen in consultation today for T12 KP at the request of NP K Meyran  Supervising Physician: Julieanne Cotton  Patient Status: [facility] [address] - Out-pt  History of Present Illness: Melissa Burke is a 68 y.o. female   3 mo ago pt picked up a 40 lb saddle Felt sudden back pain And has been painful since Percocet little relief  CT 02/25/19 IMPRESSION: 1. 40% superior endplate compression fracture of T12 with 5 mm of posterior bony retropulsion contributing to borderline central narrowing of the thecal sac at the T11-12 level. 2. There is also borderline central narrowing of the thecal sac at T12-L1 due to a disc protrusion. 3. Dorsal column stimulator noted with electrodes posteriorly at the T8-9 level, minimally eccentric to the right. 4.  Aortic Atherosclerosis (ICD10-I70.0). 5. Small to moderate-sized type 3 hiatal hernia. 6. Hepatic steatosis. 7. 2 mm left kidney upper pole nonobstructive renal calculus  Request for T12 KP Imaging reviewed with Dr Pasty Spillers procedure  Past Medical History:  Diagnosis Date   Chronic respiratory failure (HCC)     Past Surgical History:  Procedure Laterality Date   ABDOMINAL HYSTERECTOMY     BREAST SURGERY     FRACTURE SURGERY      Allergies: Erythromycin and Other  Medications: Prior to Admission medications   Medication Sig Start Date End Date Taking? Authorizing Provider  amLODipine (NORVASC) 10 MG tablet Take 1 tablet by mouth daily. 10/27/18  Yes [provider]  azithromycin (ZITHROMAX) 250 MG tablet Take 1 tablet by mouth daily. Takes on Thursdays, Wednesdays and Fridays 11/03/18  Yes [provider]  celecoxib (CELEBREX) 200 MG capsule Take 1 capsule by mouth at bedtime. 11/07/16  Yes [provider]  DULoxetine (CYMBALTA) 60 MG capsule Take 1 capsule by mouth 2 (two) times daily. 12/20/16  Yes [provider]  levothyroxine (SYNTHROID, LEVOTHROID) 100  MCG tablet Take 1 tablet by mouth daily. 12/21/16  Yes [provider]  losartan (COZAAR) 25 MG tablet Take 1 tablet by mouth daily. 11/03/18  Yes [provider]  montelukast (SINGULAIR) 10 MG tablet Take 1 tablet by mouth at bedtime. 12/21/16  Yes [provider]  Multiple Vitamins-Minerals (MULTIVITAMIN ADULT) TABS Take 1 tablet by mouth daily.   Yes [provider]  oxyCODONE-acetaminophen (PERCOCET/ROXICET) 5-325 MG tablet Take 2 tablets by mouth every 4 (four) hours as needed for severe pain. 01/04/19  Yes Triplett, Tammy, PA-C  potassium chloride SA (K-DUR,KLOR-CON) 20 MEQ tablet Take 1 tablet (20 mEq total) by mouth 2 (two) times daily. 01/04/19  Yes Triplett, Tammy, PA-C  rOPINIRole (REQUIP) 2 MG tablet Take 1 tablet by mouth at bedtime. 12/20/16  Yes [provider]  traZODone (DESYREL) 100 MG tablet Take 3 tablets by mouth at bedtime. 12/20/16  Yes [provider]  vitamin B-12 (CYANOCOBALAMIN) 1000 MCG tablet Take 1,000 mcg by mouth daily.   Yes [provider]  Vitamin D, Ergocalciferol, (DRISDOL) 1.25 MG (50000 UT) CAPS capsule Take 50,000 Units by mouth every 7 (seven) days.   Yes [provider]  cyclobenzaprine (FLEXERIL) 10 MG tablet Take 1 tablet (10 mg total) by mouth 3 (three) times daily. 01/06/19   Ivery Quale, PA-C  Ipratropium-Albuterol (COMBIVENT RESPIMAT) 20-100 MCG/ACT AERS respimat Take 1 puff by mouth 4 (four) times daily. 12/05/16   [provider]  methocarbamol (ROBAXIN) 500 MG tablet Take 1 tablet (500 mg total) by mouth 3 (three) times daily. 01/04/19   Pauline Aus, PA-C  naproxen (NAPROSYN) 250 MG tablet Take 250 mg by mouth 2 (two) times daily with a meal.    [provider]  oxyCODONE-acetaminophen (PERCOCET/ROXICET) 5-325 MG tablet Take 1 tablet by mouth every 4 (four) hours as needed. 01/04/19   Pauline Aus, PA-C     Family History  Problem Relation Age of Onset    Emphysema Mother    Cancer Sister    Heart attack Other    Colon cancer Other     Social History   Socioeconomic History   Marital status: Widowed    Spouse name: Not on file   Number of children: Not on file   Years of education: Not on file   Highest education level: Not on file  Occupational History   Not on file  Social Needs   Financial resource strain: Not on file   Food insecurity:    Worry: Not on file    Inability: Not on file   Transportation needs:    Medical: Not on file    Non-medical: Not on file  Tobacco Use   Smoking status: Never Smoker   Smokeless tobacco: Never Used  Substance and Sexual Activity   Alcohol use: Never    Frequency: Never   Drug use: Never   Sexual activity: Not on file  Lifestyle   Physical activity:    Days per week: Not on file    Minutes per session: Not on file   Stress: Not on file  Relationships   Social connections:    Talks on phone: Not on file    Gets together: Not on file    Attends religious service: Not on file    Active member of club or organization: Not on file    Attends meetings of clubs or organizations: Not on file    Relationship status: Not on file  Other Topics Concern   Not on file  Social History Narrative   Not on file    Review of Systems: A 12 point ROS discussed and pertinent positives are indicated in the HPI above.  All other systems are negative.  Review of Systems  Constitutional: Positive for activity change and appetite change. Negative for fatigue and fever.  Respiratory: Negative for cough and shortness of breath.   Cardiovascular: Negative for chest pain.  Gastrointestinal: Negative for abdominal pain.  Musculoskeletal: Positive for back pain and gait problem.  Neurological: Negative for weakness.  Psychiatric/Behavioral: Negative for behavioral problems and confusion.    Vital Signs: BP (!) 150/82    Pulse 88    Temp 98.3 F (36.8 C) (Oral)    Resp 18    Ht   (1.676 m)    Wt 180 lb (81.6 kg)    SpO2 96%    BMI 29.05 kg/m   Physical Exam Vitals signs reviewed.  Constitutional:      Appearance: Normal appearance.  Cardiovascular:     Rate and Rhythm: Normal rate and regular rhythm.     Heart sounds: Normal heart sounds.  Pulmonary:     Effort: Pulmonary effort is normal.     Breath sounds: Normal breath sounds.  Abdominal:     Palpations: Abdomen is soft.  Musculoskeletal: Normal range of motion.     Comments: Pain mid back  Skin:    General: Skin is warm and dry.  Neurological:     Mental Status: She is alert and oriented to person, place, and time.  Psychiatric:  Mood and Affect: Mood normal.        Behavior: Behavior normal.        Thought Content: Thought content normal.        Judgment: Judgment normal.     Imaging: Ct Thoracic Spine Wo Contrast  Result Date: 02/25/2019 CLINICAL DATA:  Lower thoracic wedge compression fracture. Spinal stimulator. EXAM: CT THORACIC SPINE WITHOUT CONTRAST TECHNIQUE: Multidetector CT images of the thoracic were obtained using the standard protocol without intravenous contrast. COMPARISON:  02/19/2019 FINDINGS: Alignment: No vertebral subluxation is observed. Vertebrae: 40% superior endplate compression fracture at T12 with 0.5 cm of posterior bony retropulsion on image 48/7, and fracture plane extending into the posterior margin of the vertebral body. No fracture of the pedicle or lamina. No other thoracic spine fractures. A dorsal column stimulator is present with electrodes posteriorly and slightly eccentric to the right at the T8-T9 level. Paraspinal and other soft tissues: Mild atherosclerotic calcification of the aortic arch. Small to moderate-sized type 3 hiatal hernia. Hepatic steatosis of the visualized part of the liver. 2 mm left kidney upper pole nonobstructive renal calculus. Disc levels: No significant impingement identified above T11-12. T11-12: The posterior bony retropulsion  from the superior endplate of T12 causes borderline central narrowing of the thecal sac. T12-L1: Central disc protrusion at this level causes borderline central narrowing of the thecal sac. IMPRESSION: 1. 40% superior endplate compression fracture of T12 with 5 mm of posterior bony retropulsion contributing to borderline central narrowing of the thecal sac at the T11-12 level. 2. There is also borderline central narrowing of the thecal sac at T12-L1 due to a disc protrusion. 3. Dorsal column stimulator noted with electrodes posteriorly at the T8-9 level, minimally eccentric to the right. 4.  Aortic Atherosclerosis (ICD10-I70.0). 5. Small to moderate-sized type 3 hiatal hernia. 6. Hepatic steatosis. 7. 2 mm left kidney upper pole nonobstructive renal calculus. Electronically Signed   By: Gaylyn RongWalter  Liebkemann M.D.   On: 02/25/2019 13:18    Labs:  CBC: Recent Labs    03/19/19 0830  WBC 6.1  HGB 12.7  HCT 38.4  PLT 165    COAGS: Recent Labs    03/19/19 0830  INR 0.9    BMP: Recent Labs    01/04/19 1859 03/19/19 0830  NA 140 138  K 3.1* 3.9  CL 106 105  CO2 24 23  GLUCOSE 186* 114*  BUN 10 10  CALCIUM 9.2 9.4  CREATININE 0.76 0.93  GFRNONAA >60 >60  GFRAA >60 >60    LIVER FUNCTION TESTS: No results for input(s): BILITOT, AST, ALT, ALKPHOS, PROT, ALBUMIN in the last 8760 hours.  TUMOR MARKERS: No results for input(s): AFPTM, CEA, CA199, CHROMGRNA in the last 8760 hours.  Assessment and Plan:  Pain in back for 3 mo after lifting 40 lb saddle Little relief with Percocet Thoracic 12 acute fx on CT Scheduled for Kyphoplasty Risks and benefits of T12 KP were discussed with the patient including, but not limited to education regarding the natural healing process of compression fractures without intervention, bleeding, infection, cement migration which may cause spinal cord damage, paralysis, pulmonary embolism or even death.  This interventional procedure involves the use of  X-rays and because of the nature of the planned procedure, it is possible that we will have prolonged use of X-ray fluoroscopy.  Potential radiation risks to you include (but are not limited to) the following: - A slightly elevated risk for cancer  several years later in life. This risk is  typically less than 0.5% percent. This risk is low in comparison to the normal incidence of human cancer, which is 33% for women and 50% for men according to the [employer]. - Radiation induced injury can include skin redness, resembling a rash, tissue breakdown / ulcers and hair loss (which can be temporary or permanent).   The likelihood of either of these occurring depends on the difficulty of the procedure and whether you are sensitive to radiation due to previous procedures, disease, or genetic conditions.   IF your procedure requires a prolonged use of radiation, you will be notified and given written instructions for further action.  It is your responsibility to monitor the irradiated area for the 2 weeks following the procedure and to notify your physician if you are concerned that you have suffered a radiation induced injury.    All of the patient's questions were answered, patient is agreeable to proceed.  Consent signed and in chart.  Thank you for this interesting consult.  I greatly enjoyed meeting Latrivia Fitzke and look forward to participating in their care.  A copy of this report was sent to the requesting provider on this date.  Electronically Signed: Robet Leu, PA-C 03/19/2019, 10:25 AM   I spent a total of  40 Minutes   in face to face in clinical consultation, greater than 50% of which was counseling/coordinating care for T12 KP

## 2019-03-19 NOTE — Procedures (Signed)
S /P T 12 VP 

## 2019-03-20 ENCOUNTER — Encounter (HOSPITAL_COMMUNITY): Payer: Self-pay | Admitting: Interventional Radiology

## 2019-09-21 ENCOUNTER — Encounter (HOSPITAL_COMMUNITY): Payer: Self-pay

## 2019-09-21 ENCOUNTER — Emergency Department (HOSPITAL_COMMUNITY)
Admission: EM | Admit: 2019-09-21 | Discharge: 2019-09-21 | Disposition: A | Discharge status: Home / Self Care | Payer: Medicare Other | Attending: Emergency Medicine | Admitting: Emergency Medicine

## 2019-09-21 ENCOUNTER — Emergency Department (HOSPITAL_COMMUNITY): Payer: Medicare Other

## 2019-09-21 ENCOUNTER — Other Ambulatory Visit: Payer: Self-pay

## 2019-09-21 DIAGNOSIS — Y999 Unspecified external cause status: Secondary | ICD-10-CM | POA: Diagnosis not present

## 2019-09-21 DIAGNOSIS — J45909 Unspecified asthma, uncomplicated: Secondary | ICD-10-CM | POA: Diagnosis not present

## 2019-09-21 DIAGNOSIS — Y929 Unspecified place or not applicable: Secondary | ICD-10-CM | POA: Insufficient documentation

## 2019-09-21 DIAGNOSIS — W010XXA Fall on same level from slipping, tripping and stumbling without subsequent striking against object, initial encounter: Secondary | ICD-10-CM | POA: Insufficient documentation

## 2019-09-21 DIAGNOSIS — M546 Pain in thoracic spine: Secondary | ICD-10-CM | POA: Insufficient documentation

## 2019-09-21 DIAGNOSIS — Y939 Activity, unspecified: Secondary | ICD-10-CM | POA: Insufficient documentation

## 2019-09-21 DIAGNOSIS — M549 Dorsalgia, unspecified: Secondary | ICD-10-CM

## 2019-09-21 DIAGNOSIS — Z79899 Other long term (current) drug therapy: Secondary | ICD-10-CM | POA: Insufficient documentation

## 2019-09-21 DIAGNOSIS — W19XXXA Unspecified fall, initial encounter: Secondary | ICD-10-CM

## 2019-09-21 HISTORY — DX: Post-traumatic stress disorder, unspecified: F43.10

## 2019-09-21 HISTORY — DX: Idiopathic aseptic necrosis of unspecified bone: M87.00

## 2019-09-21 HISTORY — DX: Anemia, unspecified: D64.9

## 2019-09-21 HISTORY — DX: Restless legs syndrome: G25.81

## 2019-09-21 HISTORY — DX: Unspecified osteoarthritis, unspecified site: M19.90

## 2019-09-21 HISTORY — DX: Wedge compression fracture of T11-T12 vertebra, initial encounter for closed fracture: S22.080A

## 2019-09-21 HISTORY — DX: Unspecified asthma, uncomplicated: J45.909

## 2019-09-21 HISTORY — DX: Other specified disorders of bone density and structure, unspecified site: M85.80

## 2019-09-21 HISTORY — DX: Sleep apnea, unspecified: G47.30

## 2019-09-21 HISTORY — DX: Chronic pain syndrome: G89.4

## 2019-09-21 LAB — URINALYSIS, ROUTINE W REFLEX MICROSCOPIC
Bilirubin Urine: NEGATIVE
Glucose, UA: NEGATIVE mg/dL
Hgb urine dipstick: NEGATIVE
Ketones, ur: NEGATIVE mg/dL
Leukocytes,Ua: NEGATIVE
Nitrite: NEGATIVE
Protein, ur: NEGATIVE mg/dL
Specific Gravity, Urine: 1.008 (ref 1.005–1.030)
pH: 6 (ref 5.0–8.0)

## 2019-09-21 MED ORDER — HYDROMORPHONE HCL 1 MG/ML IJ SOLN
1.0000 mg | Freq: Once | INTRAMUSCULAR | Status: AC
  Administered 2019-09-21: 20:00:00 1 mg via INTRAMUSCULAR
  Filled 2019-09-21: qty 1

## 2019-09-21 NOTE — Discharge Instructions (Addendum)
X-rays of your middle and lower back today do not show evidence of a new compression fracture.  Try alternating ice and heat to your lower back.  Follow-up with your neurosurgeon or your pain management provider for recheck.

## 2019-09-21 NOTE — ED Triage Notes (Signed)
Pt here for fall on Friday am. Pt c/o mid back pain since fall.

## 2019-09-21 NOTE — ED Notes (Signed)
Pt reports she believes she has "broken another vertebrae"  Having broken T 12 recently   She reports she  [address], now has pain to her spine from mid spine to legs bilaterally   Reports "I have a stimulator"

## 2019-09-21 NOTE — ED Provider Notes (Signed)
[facility] EMERGENCY DEPARTMENT Provider Note   CSN: [id number] Arrival date & time: 09/21/19  1449     History   Chief Complaint Chief Complaint  Patient presents with   Fall    HPI Melissa Burke is a 68 y.o. female.     HPI   Melissa Burke is a 68 y.o. female who presents to the Emergency Department complaining of mid to lower back pain secondary to a mechanical fall that occurred 3 days ago.  She describes a backwards fall landing on her back and striking the back of her head.  She reports history of chronic back pain secondary to a previous T12 compression fracture and states she is concerned that she may have another fracture of her vertebrae.  She complains of increasing pain to her back since the fall.  She has been taking her pain medication, but it is not controlling the pain.  She denies LOC, visual changes, dizziness, nausea or vomiting.  She also endorses having "spasms" across her lower back with pain radiating into her right thigh.  She denies abdominal pain, fever or chills, urine or bowel changes, numbness or weakness of the lower extremities.    Past Medical History:  Diagnosis Date   Asthma    Avascular necrosis (HCC)    Chronic pain syndrome    Chronic respiratory failure (HCC)    Compression fracture of T12 vertebra (HCC)    Osteoarthritis    Osteopenia    PTSD (post-traumatic stress disorder)    Restless leg syndrome    Severe anemia    Sleep apnea     There are no active problems to display for this patient.   Past Surgical History:  Procedure Laterality Date   ABDOMINAL HYSTERECTOMY     BLADDER REPAIR     BREAST SURGERY     FRACTURE SURGERY     IR VERTEBROPLASTY CERV/THOR BX INC UNI/BIL INC/INJECT/IMAGING  03/19/2019     OB History    Gravida  3   Para  2   Term  2   Preterm      AB  1   Living        SAB  1   TAB      Ectopic      Multiple      Live Births               Home Medications      Prior to Admission medications   Medication Sig Start Date End Date Taking? Authorizing Provider  amLODipine (NORVASC) 10 MG tablet Take 1 tablet by mouth daily. 10/27/18   [provider]  azithromycin (ZITHROMAX) 250 MG tablet Take 1 tablet by mouth daily. Takes on Thursdays, Wednesdays and Fridays 11/03/18   [provider]  celecoxib (CELEBREX) 200 MG capsule Take 1 capsule by mouth at bedtime. 11/07/16   [provider]  cyclobenzaprine (FLEXERIL) 10 MG tablet Take 1 tablet (10 mg total) by mouth 3 (three) times daily. 01/06/19   Ivery Quale, PA-C  DULoxetine (CYMBALTA) 60 MG capsule Take 1 capsule by mouth 2 (two) times daily. 12/20/16   [provider]  Ipratropium-Albuterol (COMBIVENT RESPIMAT) 20-100 MCG/ACT AERS respimat Take 1 puff by mouth 4 (four) times daily. 12/05/16   [provider]  levothyroxine (SYNTHROID, LEVOTHROID) 100 MCG tablet Take 1 tablet by mouth daily. 12/21/16   [provider]  losartan (COZAAR) 25 MG tablet Take 1 tablet by mouth daily. 11/03/18   [provider]  methocarbamol (ROBAXIN) 500 MG tablet Take 1 tablet (500 mg total) by mouth 3 (three) times daily. 01/04/19   Noreen Mackintosh, PA-C  montelukast (SINGULAIR) 10 MG tablet Take 1 tablet by mouth at bedtime. 12/21/16   [provider]  Multiple Vitamins-Minerals (MULTIVITAMIN ADULT) TABS Take 1 tablet by mouth daily.    [provider]  naproxen (NAPROSYN) 250 MG tablet Take 250 mg by mouth 2 (two) times daily with a meal.    [provider]  oxyCODONE-acetaminophen (PERCOCET/ROXICET) 5-325 MG tablet Take 2 tablets by mouth every 4 (four) hours as needed for severe pain. 01/04/19   Paytyn Mesta, PA-C  oxyCODONE-acetaminophen (PERCOCET/ROXICET) 5-325 MG tablet Take 1 tablet by mouth every 4 (four) hours as needed. 01/04/19   Evette Diclemente, PA-C  potassium chloride SA (K-DUR,KLOR-CON) 20 MEQ tablet Take 1 tablet (20 mEq total)  by mouth 2 (two) times daily. 01/04/19   Rosabel Sermeno, PA-C  rOPINIRole (REQUIP) 2 MG tablet Take 1 tablet by mouth at bedtime. 12/20/16   [provider]  traZODone (DESYREL) 100 MG tablet Take 3 tablets by mouth at bedtime. 12/20/16   [provider]  vitamin B-12 (CYANOCOBALAMIN) 1000 MCG tablet Take 1,000 mcg by mouth daily.    [provider]  Vitamin D, Ergocalciferol, (DRISDOL) 1.25 MG (50000 UT) CAPS capsule Take 50,000 Units by mouth every 7 (seven) days.    [provider]    Family History Family History  Problem Relation Age of Onset   Emphysema Mother    Cancer Sister    Heart attack Other    Colon cancer Other     Social History Social History   Tobacco Use   Smoking status: Never Smoker   Smokeless tobacco: Never Used  Substance Use Topics   Alcohol use: Never    Frequency: Never   Drug use: Never     Allergies   Erythromycin and Other   Review of Systems Review of Systems  Constitutional: Negative for fever.  Eyes: Negative for visual disturbance.  Respiratory: Negative for cough, chest tightness and shortness of breath.   Cardiovascular: Negative for chest pain.  Gastrointestinal: Negative for abdominal pain, constipation and vomiting.  Genitourinary: Negative for decreased urine volume, difficulty urinating, dysuria, flank pain and hematuria.  Musculoskeletal: Positive for back pain. Negative for joint swelling and neck stiffness.  Skin: Negative for rash.  Neurological: Negative for dizziness, syncope, weakness, numbness and headaches.     Physical Exam Updated Vital Signs BP (!) 155/105 (BP Location: Right Arm)    Pulse (!) 105    Temp 98.4 F (36.9 C) (Oral)    Resp (!) 22    Ht 5\' 6"  (1.676 m)    Wt 86.2 kg    SpO2 95%    BMI 30.67 kg/m   Physical Exam Constitutional:      Appearance: Normal appearance.     Comments: Patient is uncomfortable appearing  HENT:     Head: Atraumatic.      Mouth/Throat:     Mouth: Mucous membranes are moist.  Eyes:     Extraocular Movements: Extraocular movements intact.     Conjunctiva/sclera: Conjunctivae normal.     Pupils: Pupils are equal, round, and reactive to light.  Neck:     Musculoskeletal: Normal range of motion. Muscular tenderness (Mild tenderness of the right cervical paraspinal muscles.  Mild spasm noted along the right trapezius muscle.) present.  Cardiovascular:     Rate and Rhythm: Normal rate  and regular rhythm.     Pulses: Normal pulses.  Pulmonary:     Effort: Pulmonary effort is normal.     Breath sounds: Normal breath sounds.  Chest:     Chest wall: No tenderness.  Musculoskeletal:        General: Tenderness and signs of injury present.     Lumbar back: She exhibits tenderness and bony tenderness. She exhibits no swelling.     Comments: Patient has tenderness to the lower thoracic spine and bilateral paraspinal muscles.  Tenderness also noted with spasm present of the bilateral lumbar paraspinal muscles.  No bony step-offs, edema, or ecchymosis.  Skin:    General: Skin is warm.     Capillary Refill: Capillary refill takes less than 2 seconds.  Neurological:     Mental Status: She is alert.      ED Treatments / Results  Labs (all labs ordered are listed, but only abnormal results are displayed) Labs Reviewed  URINALYSIS, ROUTINE W REFLEX MICROSCOPIC    EKG None  Radiology Dg Thoracic Spine 2 View  Result Date: 09/21/2019 CLINICAL DATA:  Fall, history of compression fracture EXAM: THORACIC SPINE 2 VIEWS COMPARISON:  MRI lumbar spine dated 05/21/2019. MRI thoracic spine dated 03/10/2019. FINDINGS: Normal thoracic kyphosis. No evidence of acute fracture or dislocation. Mild degenerative changes of the lower thoracic spine. Prior compression fracture deformity with vertebral augmentation at T12. Thoracic spine stimulator. IMPRESSION: Prior compression fracture deformity with vertebral augmentation at T12.  Electronically Signed   By: Julian Hy M.D.   On: 09/21/2019 20:02   Dg Lumbar Spine Complete  Result Date: 09/21/2019 CLINICAL DATA:  Fall, history of compression fracture EXAM: LUMBAR SPINE - COMPLETE 4+ VIEW COMPARISON:  MRI lumbar spine dated 05/21/2019 FINDINGS: Normal lumbar lordosis. No evidence of fracture or dislocation. Vertebral body heights are maintained. Mild degenerative changes at L1-2 and L3-4. Visualized bony pelvis appears intact. IMPRESSION: No fracture or dislocation is seen. Mild degenerative changes. Electronically Signed   By: Julian Hy M.D.   On: 09/21/2019 20:02    Procedures Procedures (including critical care time)  Medications Ordered in ED Medications  HYDROmorphone (DILAUDID) injection 1 mg (has no administration in time range)     Initial Impression / Assessment and Plan / ED Course  I have reviewed the triage vital signs and the nursing notes.  Pertinent labs & imaging results that were available during my care of the patient were reviewed by me and considered in my medical decision making (see chart for details).       Patient with likely acute on chronic mid to lower back pain secondary to a mechanical fall from 3 days ago.  She is neurovascularly intact.  She is ambulatory with a slow but steady gait.  No focal neuro deficits of the upper or lower extremities.  No concerning symptoms for cauda equina.  X-rays neg for acute bony injury.  Pt feeling better after pain medication.  She has pain medication contract with her pain management.  Agrees to f/u lateral this week.  Return precautions also discussed.    Final Clinical Impressions(s) / ED Diagnoses   Final diagnoses:  Fall, initial encounter  Mid-back pain, acute    ED Discharge Orders    None       Kem Parkinson, PA-C 09/22/19 2317    Milton Ferguson, MD 09/25/19 [phone]

## 2020-06-13 ENCOUNTER — Emergency Department (HOSPITAL_COMMUNITY): Payer: Medicare Other

## 2020-06-13 ENCOUNTER — Encounter (HOSPITAL_COMMUNITY): Payer: Self-pay | Admitting: *Deleted

## 2020-06-13 ENCOUNTER — Other Ambulatory Visit: Payer: Self-pay

## 2020-06-13 ENCOUNTER — Inpatient Hospital Stay (HOSPITAL_COMMUNITY)
Admission: EM | Admit: 2020-06-13 | Discharge: 2020-06-19 | DRG: 193 | Disposition: A | Discharge status: Skilled Nursing Facility | Payer: Medicare Other | Attending: Internal Medicine | Admitting: Internal Medicine

## 2020-06-13 DIAGNOSIS — Z9981 Dependence on supplemental oxygen: Secondary | ICD-10-CM

## 2020-06-13 DIAGNOSIS — G2581 Restless legs syndrome: Secondary | ICD-10-CM | POA: Diagnosis present

## 2020-06-13 DIAGNOSIS — F4310 Post-traumatic stress disorder, unspecified: Secondary | ICD-10-CM | POA: Diagnosis present

## 2020-06-13 DIAGNOSIS — K760 Fatty (change of) liver, not elsewhere classified: Secondary | ICD-10-CM | POA: Diagnosis present

## 2020-06-13 DIAGNOSIS — J440 Chronic obstructive pulmonary disease with acute lower respiratory infection: Secondary | ICD-10-CM | POA: Diagnosis present

## 2020-06-13 DIAGNOSIS — R0602 Shortness of breath: Secondary | ICD-10-CM

## 2020-06-13 DIAGNOSIS — I7300 Raynaud's syndrome without gangrene: Secondary | ICD-10-CM | POA: Diagnosis present

## 2020-06-13 DIAGNOSIS — Z9682 Presence of neurostimulator: Secondary | ICD-10-CM

## 2020-06-13 DIAGNOSIS — J441 Chronic obstructive pulmonary disease with (acute) exacerbation: Secondary | ICD-10-CM | POA: Diagnosis present

## 2020-06-13 DIAGNOSIS — D649 Anemia, unspecified: Secondary | ICD-10-CM | POA: Diagnosis present

## 2020-06-13 DIAGNOSIS — Z7983 Long term (current) use of bisphosphonates: Secondary | ICD-10-CM

## 2020-06-13 DIAGNOSIS — J189 Pneumonia, unspecified organism: Principal | ICD-10-CM | POA: Diagnosis present

## 2020-06-13 DIAGNOSIS — E871 Hypo-osmolality and hyponatremia: Secondary | ICD-10-CM | POA: Diagnosis present

## 2020-06-13 DIAGNOSIS — M8580 Other specified disorders of bone density and structure, unspecified site: Secondary | ICD-10-CM | POA: Diagnosis present

## 2020-06-13 DIAGNOSIS — J9621 Acute and chronic respiratory failure with hypoxia: Secondary | ICD-10-CM | POA: Diagnosis present

## 2020-06-13 DIAGNOSIS — I10 Essential (primary) hypertension: Secondary | ICD-10-CM | POA: Diagnosis present

## 2020-06-13 DIAGNOSIS — Z8249 Family history of ischemic heart disease and other diseases of the circulatory system: Secondary | ICD-10-CM

## 2020-06-13 DIAGNOSIS — G4733 Obstructive sleep apnea (adult) (pediatric): Secondary | ICD-10-CM | POA: Diagnosis present

## 2020-06-13 DIAGNOSIS — R5381 Other malaise: Secondary | ICD-10-CM | POA: Diagnosis present

## 2020-06-13 DIAGNOSIS — Z79890 Hormone replacement therapy (postmenopausal): Secondary | ICD-10-CM

## 2020-06-13 DIAGNOSIS — Z79899 Other long term (current) drug therapy: Secondary | ICD-10-CM

## 2020-06-13 DIAGNOSIS — G894 Chronic pain syndrome: Secondary | ICD-10-CM | POA: Diagnosis present

## 2020-06-13 DIAGNOSIS — K449 Diaphragmatic hernia without obstruction or gangrene: Secondary | ICD-10-CM | POA: Diagnosis present

## 2020-06-13 DIAGNOSIS — Z20822 Contact with and (suspected) exposure to covid-19: Secondary | ICD-10-CM | POA: Diagnosis present

## 2020-06-13 DIAGNOSIS — M1990 Unspecified osteoarthritis, unspecified site: Secondary | ICD-10-CM | POA: Diagnosis present

## 2020-06-13 DIAGNOSIS — Z8701 Personal history of pneumonia (recurrent): Secondary | ICD-10-CM

## 2020-06-13 DIAGNOSIS — Z881 Allergy status to other antibiotic agents status: Secondary | ICD-10-CM

## 2020-06-13 DIAGNOSIS — M879 Osteonecrosis, unspecified: Secondary | ICD-10-CM | POA: Diagnosis present

## 2020-06-13 DIAGNOSIS — Z91048 Other nonmedicinal substance allergy status: Secondary | ICD-10-CM

## 2020-06-13 DIAGNOSIS — Z825 Family history of asthma and other chronic lower respiratory diseases: Secondary | ICD-10-CM

## 2020-06-13 LAB — CBC WITH DIFFERENTIAL/PLATELET
Abs Immature Granulocytes: 0.03 10*3/uL (ref 0.00–0.07)
Basophils Absolute: 0.1 10*3/uL (ref 0.0–0.1)
Basophils Relative: 1 %
Eosinophils Absolute: 0.3 10*3/uL (ref 0.0–0.5)
Eosinophils Relative: 4 %
HCT: 38.6 % (ref 36.0–46.0)
Hemoglobin: 12.6 g/dL (ref 12.0–15.0)
Immature Granulocytes: 0 %
Lymphocytes Relative: 8 %
Lymphs Abs: 0.6 10*3/uL — ABNORMAL LOW (ref 0.7–4.0)
MCH: 33 pg (ref 26.0–34.0)
MCHC: 32.6 g/dL (ref 30.0–36.0)
MCV: 101 fL — ABNORMAL HIGH (ref 80.0–100.0)
Monocytes Absolute: 1.3 10*3/uL — ABNORMAL HIGH (ref 0.1–1.0)
Monocytes Relative: 18 %
Neutro Abs: 4.9 10*3/uL (ref 1.7–7.7)
Neutrophils Relative %: 69 %
Platelets: 202 10*3/uL (ref 150–400)
RBC: 3.82 MIL/uL — ABNORMAL LOW (ref 3.87–5.11)
RDW: 12.9 % (ref 11.5–15.5)
WBC: 7.1 10*3/uL (ref 4.0–10.5)
nRBC: 0 % (ref 0.0–0.2)

## 2020-06-13 LAB — BASIC METABOLIC PANEL
Anion gap: 12 (ref 5–15)
BUN: 12 mg/dL (ref 8–23)
CO2: 28 mmol/L (ref 22–32)
Calcium: 9.7 mg/dL (ref 8.9–10.3)
Chloride: 94 mmol/L — ABNORMAL LOW (ref 98–111)
Creatinine, Ser: 0.73 mg/dL (ref 0.44–1.00)
GFR calc Af Amer: >60 mL/min (ref >60)
GFR calc non Af Amer: >60 mL/min (ref >60)
Glucose, Bld: 117 mg/dL — ABNORMAL HIGH (ref 70–99)
Potassium: 3.6 mmol/L (ref 3.5–5.1)
Sodium: 134 mmol/L — ABNORMAL LOW (ref 135–145)

## 2020-06-13 LAB — BRAIN NATRIURETIC PEPTIDE: B Natriuretic Peptide: 27 pg/mL (ref 0.0–100.0)

## 2020-06-13 LAB — TROPONIN I (HIGH SENSITIVITY)
Troponin I (High Sensitivity): 5 ng/L (ref <18)
Troponin I (High Sensitivity): 5 ng/L (ref <18)

## 2020-06-13 LAB — D-DIMER, QUANTITATIVE: D-Dimer, Quant: 0.76 ug/mL-FEU — ABNORMAL HIGH (ref 0.00–0.50)

## 2020-06-13 LAB — SARS CORONAVIRUS 2 BY RT PCR (HOSPITAL ORDER, PERFORMED IN ~~LOC~~ HOSPITAL LAB)
SARS Coronavirus 2: NEGATIVE
SARS Coronavirus 2: NEGATIVE

## 2020-06-13 LAB — C-REACTIVE PROTEIN: CRP: 14.1 mg/dL — ABNORMAL HIGH (ref <1.0)

## 2020-06-13 MED ORDER — TRAZODONE HCL 50 MG PO TABS
300.0000 mg | ORAL_TABLET | Freq: Every day | ORAL | Status: DC
  Administered 2020-06-13 – 2020-06-18 (×6): 300 mg via ORAL
  Filled 2020-06-13 (×6): qty 6

## 2020-06-13 MED ORDER — METHOCARBAMOL 500 MG PO TABS
750.0000 mg | ORAL_TABLET | Freq: Three times a day (TID) | ORAL | Status: DC | PRN
  Administered 2020-06-14 – 2020-06-19 (×11): 750 mg via ORAL
  Filled 2020-06-13 (×11): qty 2

## 2020-06-13 MED ORDER — LEVOTHYROXINE SODIUM 112 MCG PO TABS
112.0000 ug | ORAL_TABLET | Freq: Every day | ORAL | Status: DC
  Administered 2020-06-14 – 2020-06-19 (×6): 112 ug via ORAL
  Filled 2020-06-13 (×10): qty 1

## 2020-06-13 MED ORDER — ACETAMINOPHEN 325 MG PO TABS: 650.0000 mg | ORAL_TABLET | Freq: Four times a day (QID) | ORAL | Status: DC | PRN

## 2020-06-13 MED ORDER — PANTOPRAZOLE SODIUM 40 MG PO TBEC
40.0000 mg | DELAYED_RELEASE_TABLET | Freq: Two times a day (BID) | ORAL | Status: DC
  Administered 2020-06-13 – 2020-06-19 (×12): 40 mg via ORAL
  Filled 2020-06-13 (×12): qty 1

## 2020-06-13 MED ORDER — IPRATROPIUM-ALBUTEROL 0.5-2.5 (3) MG/3ML IN SOLN
3.0000 mL | Freq: Four times a day (QID) | RESPIRATORY_TRACT | Status: DC | PRN
  Administered 2020-06-16 – 2020-06-19 (×4): 3 mL via RESPIRATORY_TRACT
  Filled 2020-06-13 (×3): qty 3

## 2020-06-13 MED ORDER — LORATADINE 10 MG PO TABS
10.0000 mg | ORAL_TABLET | Freq: Every day | ORAL | Status: DC
  Administered 2020-06-14 – 2020-06-19 (×6): 10 mg via ORAL
  Filled 2020-06-13 (×6): qty 1

## 2020-06-13 MED ORDER — SODIUM CHLORIDE 0.9 % IV SOLN
500.0000 mg | Freq: Once | INTRAVENOUS | Status: AC
  Administered 2020-06-13: 500 mg via INTRAVENOUS
  Filled 2020-06-13: qty 500

## 2020-06-13 MED ORDER — MENTHOL 3 MG MT LOZG
1.0000 | LOZENGE | OROMUCOSAL | Status: DC | PRN
  Filled 2020-06-13: qty 9

## 2020-06-13 MED ORDER — LOSARTAN POTASSIUM 50 MG PO TABS
25.0000 mg | ORAL_TABLET | Freq: Every day | ORAL | Status: DC
  Administered 2020-06-14 – 2020-06-19 (×6): 25 mg via ORAL
  Filled 2020-06-13 (×6): qty 1

## 2020-06-13 MED ORDER — ACETAMINOPHEN 650 MG RE SUPP: 650.0000 mg | Freq: Four times a day (QID) | RECTAL | Status: DC | PRN

## 2020-06-13 MED ORDER — SODIUM CHLORIDE 0.9 % IV SOLN
1.0000 g | Freq: Once | INTRAVENOUS | Status: AC
  Administered 2020-06-13: 1 g via INTRAVENOUS
  Filled 2020-06-13: qty 10

## 2020-06-13 MED ORDER — AMLODIPINE BESYLATE 5 MG PO TABS
10.0000 mg | ORAL_TABLET | Freq: Every day | ORAL | Status: DC
  Administered 2020-06-14 – 2020-06-19 (×6): 10 mg via ORAL
  Filled 2020-06-13 (×6): qty 2

## 2020-06-13 MED ORDER — ROPINIROLE HCL 1 MG PO TABS
4.0000 mg | ORAL_TABLET | Freq: Every day | ORAL | Status: DC
  Administered 2020-06-13 – 2020-06-18 (×6): 4 mg via ORAL
  Filled 2020-06-13 (×9): qty 4

## 2020-06-13 MED ORDER — SENNOSIDES-DOCUSATE SODIUM 8.6-50 MG PO TABS
1.0000 | ORAL_TABLET | Freq: Every evening | ORAL | Status: DC | PRN
  Filled 2020-06-13: qty 1

## 2020-06-13 MED ORDER — DEXAMETHASONE SODIUM PHOSPHATE 10 MG/ML IJ SOLN
6.0000 mg | INTRAMUSCULAR | Status: DC
  Administered 2020-06-13: 6 mg via INTRAVENOUS
  Filled 2020-06-13: qty 1

## 2020-06-13 MED ORDER — ENOXAPARIN SODIUM 40 MG/0.4ML ~~LOC~~ SOLN
40.0000 mg | SUBCUTANEOUS | Status: DC
  Administered 2020-06-13 – 2020-06-18 (×6): 40 mg via SUBCUTANEOUS
  Filled 2020-06-13 (×6): qty 0.4

## 2020-06-13 MED ORDER — MONTELUKAST SODIUM 10 MG PO TABS
10.0000 mg | ORAL_TABLET | Freq: Every day | ORAL | Status: DC
  Administered 2020-06-13 – 2020-06-18 (×6): 10 mg via ORAL
  Filled 2020-06-13 (×6): qty 1

## 2020-06-13 MED ORDER — DULOXETINE HCL 60 MG PO CPEP
60.0000 mg | ORAL_CAPSULE | Freq: Two times a day (BID) | ORAL | Status: DC
  Administered 2020-06-13 – 2020-06-19 (×12): 60 mg via ORAL
  Filled 2020-06-13: qty 1
  Filled 2020-06-13 (×2): qty 2
  Filled 2020-06-13 (×4): qty 1
  Filled 2020-06-13 (×2): qty 2
  Filled 2020-06-13 (×3): qty 1

## 2020-06-13 MED ORDER — SODIUM CHLORIDE 0.9 % IV SOLN
1.0000 g | INTRAVENOUS | Status: DC
  Administered 2020-06-14 – 2020-06-18 (×5): 1 g via INTRAVENOUS
  Filled 2020-06-13 (×6): qty 10

## 2020-06-13 MED ORDER — GUAIFENESIN ER 600 MG PO TB12: 1200.0000 mg | ORAL_TABLET | Freq: Two times a day (BID) | ORAL | Status: DC | PRN

## 2020-06-13 MED ORDER — SODIUM CHLORIDE 0.9 % IV BOLUS
1000.0000 mL | Freq: Once | INTRAVENOUS | Status: AC
  Administered 2020-06-13: 1000 mL via INTRAVENOUS

## 2020-06-13 MED ORDER — IOHEXOL 350 MG/ML SOLN
75.0000 mL | Freq: Once | INTRAVENOUS | Status: AC | PRN
  Administered 2020-06-13: 75 mL via INTRAVENOUS

## 2020-06-13 MED ORDER — HYDROCODONE-ACETAMINOPHEN 10-325 MG PO TABS
1.0000 | ORAL_TABLET | Freq: Four times a day (QID) | ORAL | Status: DC | PRN
  Administered 2020-06-14 – 2020-06-19 (×18): 1 via ORAL
  Filled 2020-06-13 (×18): qty 1

## 2020-06-13 MED ORDER — CALCIUM CARBONATE 1250 (500 CA) MG PO TABS
500.0000 mg | ORAL_TABLET | Freq: Two times a day (BID) | ORAL | Status: DC
  Administered 2020-06-14 – 2020-06-19 (×11): 500 mg via ORAL
  Filled 2020-06-13 (×16): qty 1

## 2020-06-13 MED ORDER — IPRATROPIUM-ALBUTEROL 0.5-2.5 (3) MG/3ML IN SOLN
3.0000 mL | Freq: Four times a day (QID) | RESPIRATORY_TRACT | Status: DC
  Administered 2020-06-13 – 2020-06-16 (×10): 3 mL via RESPIRATORY_TRACT
  Filled 2020-06-13 (×11): qty 3

## 2020-06-13 MED ORDER — CELECOXIB 100 MG PO CAPS
200.0000 mg | ORAL_CAPSULE | Freq: Every day | ORAL | Status: DC
  Administered 2020-06-13 – 2020-06-18 (×6): 200 mg via ORAL
  Filled 2020-06-13 (×5): qty 2

## 2020-06-13 MED ORDER — VITAMIN D 25 MCG (1000 UNIT) PO TABS
5000.0000 [IU] | ORAL_TABLET | Freq: Every day | ORAL | Status: DC
  Administered 2020-06-14 – 2020-06-19 (×6): 5000 [IU] via ORAL
  Filled 2020-06-13 (×8): qty 5

## 2020-06-13 NOTE — ED Provider Notes (Signed)
  Pt signed out to me by Harlene Salts, PA-C pending consult with hospitalist for admission.    Patient here with 2 week hx of dyspnea and chest pain and found to be mildly hypoxia and multifocal pneumonia on CT angio of the chest.  She was given Rocephin and Zithromax.  Patient has chronic oxygen requirement of 4 L at nighttime secondary to sleep apnea, but does not use oxygen during the day.  Oxygen dropped to 93% on 3 L by nasal cannula.  Covid test here was negative, but given patient's symptoms and CT evidence of bilateral groundglass opacities, Covid test was reordered and pending.    On my exam, patient is resting comfortably she is currently on 3 L of oxygen by nasal cannula and maintaining O2 sats in the mid 90s.  No increased work of breathing.  2010  Still waiting for hospitalist consult.  Asked asked secretary to contact Company secretary and repage.  2030 spoke with hospitalist, Dr. Margette Fast who agrees to admit.    Pauline Aus, PA-C 06/13/20 2031    Bethann Berkshire, MD 06/13/20 2037

## 2020-06-13 NOTE — ED Triage Notes (Signed)
C/o shortness of breath and lungs burning onset 2 weeks ago.

## 2020-06-13 NOTE — H&P (Signed)
Melissa Burke is an 69 y.o. female admitted on account of worsening hypoxia with exertion, generalized weakness.  Symptoms were preceded by fever chills and severe headache.  Onset of symptoms were 2 weeks ago.  Chief Complaint: HPI:  Patient is a 69 year old Caucasian female who is up-to-date with her Covid vaccination.  Her medical history is significant for chronic respiratory failure secondary to obstructive sleep apnea on trilogy at home.  She has had recurrent admissions in the past secondary to pneumonia, status post intubations in 2010.  Comorbidities include chronic pain syndrome, hypertension, Raynaud's disease, chronic anemia, restless leg syndrome and status post spinal cord stimulation system .  Patient presented with a 2-week history of symptoms of severe headache, recurrent fever and chills now associated with generalized weakness and exertional dyspnea.  During that period, patient did not get tested until this admission.  She denies any known sick contacts.  She lives by self and has been compliant with her medications.  In the ED, Covid PCR test was done x2 came back negative.  CTA scan of the chest however shows evidence of multifocal pneumonia.  Patient was attempted to be ambulated but desaturated below the 90s.  Patient was referred to hospitalist service for further evaluation and management.  Patient denies any chest pain or palpitations at this time.  Denies any nausea or vomiting.  Denies any loss of taste.  Past Medical History:  Diagnosis Date  . Asthma   . Avascular necrosis (HCC)   . Chronic pain syndrome   . Chronic respiratory failure (HCC)   . Compression fracture of T12 vertebra (HCC)   . Osteoarthritis   . Osteopenia   . PTSD (post-traumatic stress disorder)   . Restless leg syndrome   . Severe anemia   . Sleep apnea     Past Surgical History:  Procedure Laterality Date  . ABDOMINAL HYSTERECTOMY    . BLADDER REPAIR    . BREAST SURGERY    . FRACTURE  SURGERY    . IR VERTEBROPLASTY CERV/THOR BX INC UNI/BIL INC/INJECT/IMAGING  03/19/2019    Family History  Problem Relation Age of Onset  . Emphysema Mother   . Cancer Sister   . Heart attack Other   . Colon cancer Other    Social History:  reports that she has never smoked. She has never used smokeless tobacco. She reports that she does not drink alcohol and does not use drugs.  Allergies:  Allergies  Allergen Reactions  . Erythromycin Diarrhea  . Other     Metal    (Not in a hospital admission)   Results for orders placed or performed during the hospital encounter of 06/13/20 (from the past 48 hour(s))  Basic metabolic panel     Status: Abnormal   Collection Time: 06/13/20 11:59 AM  Result Value Ref Range   Sodium 134 (L) 135 - 145 mmol/L   Potassium 3.6 3.5 - 5.1 mmol/L   Chloride 94 (L) 98 - 111 mmol/L   CO2 28 22 - 32 mmol/L   Glucose, Bld 117 (H) 70 - 99 mg/dL    Comment: Glucose reference range applies only to samples taken after fasting for at least 8 hours.   BUN 12 8 - 23 mg/dL   Creatinine, Ser 0.93 0.44 - 1.00 mg/dL   Calcium 9.7 8.9 - 23.5 mg/dL   GFR calc non Af Amer >60 >60 mL/min   GFR calc Af Amer >60 >60 mL/min   Anion gap 12 5 - 15  Comment: Performed at [facility], [address] [place], [place] [address]  Troponin I (High Sensitivity)     Status: None   Collection Time: 06/13/20 11:59 AM  Result Value Ref Range   Troponin I (High Sensitivity) 5 <18 ng/L    Comment: (NOTE) Elevated high sensitivity troponin I (hsTnI) values and significant  changes across serial measurements may suggest ACS but many other  chronic and acute conditions are known to elevate hsTnI results.  Refer to the "Links" section for chest pain algorithms and additional  guidance. Performed at [facility], [address] [place], [place] [address]   SARS Coronavirus 2 by RT PCR (hospital order, performed in [facility] hospital lab) Nasopharyngeal Nasopharyngeal Swab      Status: None   Collection Time: 06/13/20 12:21 PM   Specimen: Nasopharyngeal Swab  Result Value Ref Range   SARS Coronavirus 2 NEGATIVE NEGATIVE    Comment: (NOTE) SARS-CoV-2 target nucleic acids are NOT DETECTED.  The SARS-CoV-2 RNA is generally detectable in upper and lower respiratory specimens during the acute phase of infection. The lowest concentration of SARS-CoV-2 viral copies this assay can detect is 250 copies / mL. A negative result does not preclude SARS-CoV-2 infection and should not be used as the sole basis for treatment or other patient management decisions.  A negative result may occur with improper specimen collection / handling, submission of specimen other than nasopharyngeal swab, presence of viral mutation(s) within the areas targeted by this assay, and inadequate number of viral copies (<250 copies / mL). A negative result must be combined with clinical observations, patient history, and epidemiological information.  Fact Sheet for Patients:   BoilerBrush.com.cy  Fact Sheet for Healthcare Providers: https://pope.com/  This test is not yet approved or  cleared by the [place] FDA and has been authorized for detection and/or diagnosis of SARS-CoV-2 by FDA under an Emergency Use Authorization (EUA).  This EUA will remain in effect (meaning this test can be used) for the duration of the COVID-19 declaration under Section 564(b)(1) of the Act, 21 U.S.C. section 360bbb-3(b)(1), unless the authorization is terminated or revoked sooner.  Performed at [facility], [address] [place], [place] [address]   CBC with Differential     Status: Abnormal   Collection Time: 06/13/20  2:31 PM  Result Value Ref Range   WBC 7.1 4.0 - 10.5 K/uL   RBC 3.82 (L) 3.87 - 5.11 MIL/uL   Hemoglobin 12.6 12.0 - 15.0 g/dL   HCT 83.1 36 - 46 %   MCV 101.0 (H) 80.0 - 100.0 fL   MCH 33.0 26.0 - 34.0 pg   MCHC 32.6 30.0 - 36.0  g/dL   RDW 51.7 61.6 - 07.3 %   Platelets 202 150 - 400 K/uL   nRBC 0.0 0.0 - 0.2 %   Neutrophils Relative % 69 %   Neutro Abs 4.9 1.7 - 7.7 K/uL   Lymphocytes Relative 8 %   Lymphs Abs 0.6 (L) 0.7 - 4.0 K/uL   Monocytes Relative 18 %   Monocytes Absolute 1.3 (H) 0 - 1 K/uL   Eosinophils Relative 4 %   Eosinophils Absolute 0.3 0 - 0 K/uL   Basophils Relative 1 %   Basophils Absolute 0.1 0 - 0 K/uL   Immature Granulocytes 0 %   Abs Immature Granulocytes 0.03 0.00 - 0.07 K/uL    Comment: Performed at [facility], [address] [place], [place] [address]  Brain natriuretic peptide  Status: None   Collection Time: 06/13/20  2:31 PM  Result Value Ref Range   B Natriuretic Peptide 27.0 0.0 - 100.0 pg/mL    Comment: Performed at [facility], [address], [place], [place] [address]  D-dimer, quantitative (not at [facility])     Status: Abnormal   Collection Time: 06/13/20  2:42 PM  Result Value Ref Range   D-Dimer, Quant 0.76 (H) 0.00 - 0.50 ug/mL-FEU    Comment: (NOTE) At the manufacturer cut-off of 0.50 ug/mL FEU, this assay has been documented to exclude PE with a sensitivity and negative predictive value of 97 to 99%.  At this time, this assay has not been approved by the FDA to exclude DVT/VTE. Results should be correlated with clinical presentation. Performed at [facility], [address] [place], [place] [address]   SARS Coronavirus 2 by RT PCR (hospital order, performed in [facility] hospital lab) Nasopharyngeal Nasopharyngeal Swab     Status: None   Collection Time: 06/13/20  7:02 PM   Specimen: Nasopharyngeal Swab  Result Value Ref Range   SARS Coronavirus 2 NEGATIVE NEGATIVE    Comment: (NOTE) SARS-CoV-2 target nucleic acids are NOT DETECTED.  The SARS-CoV-2 RNA is generally detectable in upper and lower respiratory specimens during the acute phase of infection. The lowest concentration of SARS-CoV-2 viral copies this assay can detect is 250 copies / mL. A  negative result does not preclude SARS-CoV-2 infection and should not be used as the sole basis for treatment or other patient management decisions.  A negative result may occur with improper specimen collection / handling, submission of specimen other than nasopharyngeal swab, presence of viral mutation(s) within the areas targeted by this assay, and inadequate number of viral copies (<250 copies / mL). A negative result must be combined with clinical observations, patient history, and epidemiological information.  Fact Sheet for Patients:   BoilerBrush.com.cy  Fact Sheet for Healthcare Providers: https://pope.com/  This test is not yet approved or  cleared by the [place] FDA and has been authorized for detection and/or diagnosis of SARS-CoV-2 by FDA under an Emergency Use Authorization (EUA).  This EUA will remain in effect (meaning this test can be used) for the duration of the COVID-19 declaration under Section 564(b)(1) of the Act, 21 U.S.C. section 360bbb-3(b)(1), unless the authorization is terminated or revoked sooner.  Performed at [facility], [address], [place], [place] [address]    CT Angio Chest PE W and/or Wo Contrast  Result Date: 06/13/2020 CLINICAL DATA:  69 year old female with acute shortness of breath and elevated D-dimer. EXAM: CT ANGIOGRAPHY CHEST WITH CONTRAST TECHNIQUE: Multidetector CT imaging of the chest was performed using the standard protocol during bolus administration of intravenous contrast. Multiplanar CT image reconstructions and MIPs were obtained to evaluate the vascular anatomy. CONTRAST:  75mL OMNIPAQUE IOHEXOL 350 MG/ML SOLN COMPARISON:  06/13/2020 chest radiograph. 02/25/2019 thoracic spine CT FINDINGS: Cardiovascular: This is a technically adequate study but respiratory motion artifact decreases sensitivity in the LOWER lungs. No definite pulmonary emboli are identified. Cardiomegaly is  noted. No thoracic aortic aneurysm or pericardial effusion identified. Mediastinum/Nodes: No enlarged mediastinal, hilar, or axillary lymph nodes. Multiple shotty hilar and mediastinal lymph nodes are present. Thyroid gland, trachea, and esophagus demonstrate no significant findings. Lungs/Pleura: Diffuse bilateral ground-glass opacities are noted bilaterally, greatest in the UPPER lobes. No discrete mass, pleural effusion or pneumothorax noted. Upper Abdomen: Diffuse hepatic steatosis noted. A moderate hiatal hernia is identified. Musculoskeletal: No acute or suspicious bony  abnormalities are identified. A thoracic cord stimulator is present. A RIGHT breast prosthesis and LEFT breast surgical changes noted. Review of the MIP images confirms the above findings. IMPRESSION: 1. No evidence of pulmonary emboli or thoracic aortic aneurysm. 2. Diffuse bilateral ground-glass opacities, greatest in the UPPER lobes, nonspecific but suspicious for infection/pneumonia. Clinical/radiographic follow-up is recommended. 3. Cardiomegaly. 4. Hepatic steatosis. 5. Moderate hiatal hernia. Electronically Signed   By: Harmon PierJeffrey  Hu M.D.   On: 06/13/2020 17:57   DG Chest Port 1 View  Result Date: 06/13/2020 CLINICAL DATA:  Shortness of breath, lungs burning, onset of symptoms 2 weeks ago, COVID-19 test results pending; history asthma, chronic respiratory failure EXAM: PORTABLE CHEST 1 VIEW COMPARISON:  Portable exam 1235 hours without priors for comparison FINDINGS: Intraspinal stimulator projects over lower thoracic spine. Normal heart size and pulmonary vascularity. Moderate-sized hiatal hernia. Patchy infiltrates throughout both lungs greatest in RIGHT upper lobe favor pneumonia. No pleural effusion or pneumothorax. IMPRESSION: Patchy BILATERAL pulmonary infiltrates greatest in RIGHT upper lobe, favor pneumonia. Hiatal hernia. Electronically Signed   By: Ulyses SouthwardMark  Boles M.D.   On: 06/13/2020 12:52    Review of Systems   Constitutional: Positive for fatigue and fever.  HENT: Negative.   Eyes: Negative.   Respiratory: Positive for cough, chest tightness and shortness of breath.   Gastrointestinal: Negative.   Endocrine: Negative.   Genitourinary: Negative.   Musculoskeletal: Negative.   Allergic/Immunologic: Negative.   Psychiatric/Behavioral: Negative.     Blood pressure 127/64, pulse 93, temperature 98.7 F (37.1 C), resp. rate (!) 30, height 5\' 6"  (1.676 m), SpO2 98 %. Physical Exam Vitals and nursing note reviewed.  Constitutional:      Appearance: She is well-developed.  HENT:     Head: Normocephalic.  Cardiovascular:     Rate and Rhythm: Tachycardia present.  Pulmonary:     Effort: Pulmonary effort is normal.     Breath sounds: Decreased breath sounds present.  Abdominal:     General: Bowel sounds are normal.     Palpations: Abdomen is soft.     Tenderness: There is no guarding.  Skin:    General: Skin is warm and dry.  Neurological:     General: No focal deficit present.     Mental Status: She is alert and oriented to person, place, and time.  Psychiatric:        Mood and Affect: Mood normal.        Behavior: Behavior normal.      Assessment/Plan Melissa Burke is a 69 y.o. female admitted on account of worsening hypoxia with exertion, and generalized weakness.  Symptoms were preceded by fever,chills and severe headache.  Onset of symptoms were 2 weeks ago.   #1.  Acute on chronic respiratory failure.  Present on admission.  Symptoms were preceded by severe headache, intermittent fever and chills, now associated with significant hypoxia with exertion.  CT angiogram confirms diffuse ground glass opacities c/w viralpneumonia.  Despite Covid test coming back negative, highly suspicious for COVID-19 sequelae post 14 days of symptoms.  Symptoms may have been muffled due to vaccination history.D-dimer mildly elevated.PE ruled out. CRP pending.  Patient is NOT a candidate for remdesivir  but could benefit from Decadron until further evaluation by pulmonology consult team.  Albuterol as needed.  #2.  Multifocal pneumonia.  Known history of recurrent pneumonia secondary to bacteria.  Patient was empirically covered with antibiotics.  Blood cultures were obtained and pending.  No evidence of elevated WBC or left shift  making bacterial infection less likely.  #3.  History of central sleep apnea on trilogy at home.  Patient will be tried on CPAP during the course of stay.  #4.  History of chronic pain syndrome: Continue with another 6 hours being given at home.  #5.  History of restless leg syndrome on ropinirole.  Continue with same.  #6.  Mild hyponatremia: Continue with IV fluids.  Follow-up CMP in a.m.  #7. Acute debility: PT/OT to work with patient.  #8. Incidental findings of Hepatic steatosis and Hiatal hernia  VTE prophylaxis with Lovenox.   Lilia Pro, MD 06/13/2020, 9:36 PM

## 2020-06-13 NOTE — ED Notes (Signed)
Pt transported to CT ?

## 2020-06-13 NOTE — ED Provider Notes (Signed)
[facility] EMERGENCY DEPARTMENT Provider Note   CSN: [id number] Arrival date & time: 06/13/20  1131     History Chief Complaint  Patient presents with  . Shortness of Breath    Melissa Burke is a 69 y.o. female history of asthma, AVN, chronic pain, chronic respiratory failure (uses 4 L BiPAP at night, no O2 use during the day), RLS, anemia, PTSD, osteoarthritis.  Patient presents today for chest pain shortness of breath onset 2 weeks ago.  She reports around 2 weeks ago she developed a mild headache that lasted for a few days before resolving, afterwards she developed chest pain which she describes as a sharp burning sensation whenever she takes a deep breath this has been gradually worsening for the past 2 weeks no clear inciting event.  She describes sharp burning as severe has become constant over the last few days worsened with exertion and deep breathing.  Associated with nonproductive cough.  Patient reports feeling more short of breath over the last 3-4 days, noted to be hypoxic by nursing staff and started on 3 L nasal cannula in the ER.  Patient reports that she has measured fevers up to 101.72F this week.  Denies headache, vision changes, neck stiffness, sore throat, vomiting, diarrhea, extremity swelling/color change or any additional concerns.  Patient reports she received 2 doses maternal Covid vaccine April 2021.  HPI     Past Medical History:  Diagnosis Date  . Asthma   . Avascular necrosis (HCC)   . Chronic pain syndrome   . Chronic respiratory failure (HCC)   . Compression fracture of T12 vertebra (HCC)   . Osteoarthritis   . Osteopenia   . PTSD (post-traumatic stress disorder)   . Restless leg syndrome   . Severe anemia   . Sleep apnea     There are no problems to display for this patient.   Past Surgical History:  Procedure Laterality Date  . ABDOMINAL HYSTERECTOMY    . BLADDER REPAIR    . BREAST SURGERY    . FRACTURE SURGERY    . IR  VERTEBROPLASTY CERV/THOR BX INC UNI/BIL INC/INJECT/IMAGING  03/19/2019     OB History    Gravida  3   Para  2   Term  2   Preterm      AB  1   Living        SAB  1   TAB      Ectopic      Multiple      Live Births              Family History  Problem Relation Age of Onset  . Emphysema Mother   . Cancer Sister   . Heart attack Other   . Colon cancer Other     Social History   Tobacco Use  . Smoking status: Never Smoker  . Smokeless tobacco: Never Used  Vaping Use  . Vaping Use: Never used  Substance Use Topics  . Alcohol use: Never  . Drug use: Never    Home Medications Prior to Admission medications   Medication Sig Start Date End Date Taking? Authorizing Provider  alendronate (FOSAMAX) 70 MG tablet Take 70 mg by mouth every Monday. Take with a full glass of water on an empty stomach in the morning   Yes [provider]  amLODipine (NORVASC) 10 MG tablet Take 10 mg by mouth in the morning.  10/27/18  Yes [provider]  ammonium lactate (AMLACTIN) 12 %  cream Apply topically as needed for dry skin.  06/13/20  Yes [provider]  azithromycin (ZITHROMAX) 250 MG tablet Take 1 tablet by mouth every Monday, Wednesday, and Friday. In the morning 11/03/18  Yes [provider]  calcium carbonate (OSCAL) 1500 (600 Ca) MG TABS tablet Take 600 mg of elemental calcium by mouth 2 (two) times daily with a meal.   Yes [provider]  celecoxib (CELEBREX) 200 MG capsule Take 200 mg by mouth at bedtime.  11/07/16  Yes [provider]  Cholecalciferol (VITAMIN D3) 125 MCG (5000 UT) CAPS Take 1 capsule by mouth in the morning.   Yes [provider]  DULoxetine (CYMBALTA) 60 MG capsule Take 60 mg by mouth 2 (two) times daily.  12/20/16  Yes [provider]  HYDROcodone-acetaminophen (NORCO) 10-325 MG tablet Take 1 tablet by mouth 4 (four) times daily as needed for moderate pain.   Yes [provider]  Ipratropium-Albuterol (COMBIVENT RESPIMAT) 20-100 MCG/ACT AERS respimat Take 1 puff by mouth every 6 (six) hours as needed for wheezing or shortness of breath.  12/05/16  Yes [provider]  ipratropium-albuterol (DUONEB) 0.5-2.5 (3) MG/3ML SOLN Take 3 mLs by nebulization every 6 (six) hours as needed (for shortness of breath).   Yes [provider]  levothyroxine (SYNTHROID) 112 MCG tablet Take 112 mcg by mouth in the morning.  12/21/16  Yes [provider]  loratadine (CLARITIN) 10 MG tablet Take 10 mg by mouth in the morning.   Yes [provider]  losartan (COZAAR) 25 MG tablet Take 25 mg by mouth in the morning.  11/03/18  Yes [provider]  methocarbamol (ROBAXIN) 750 MG tablet Take 750 mg by mouth 3 (three) times daily as needed for muscle spasms.   Yes [provider]  montelukast (SINGULAIR) 10 MG tablet Take 10 mg by mouth at bedtime.  12/21/16  Yes [provider]  Multiple Vitamins-Minerals (MULTIVITAMIN ADULT) TABS Take 1 tablet by mouth in the morning.    Yes [provider]  nortriptyline (PAMELOR) 25 MG capsule Take 75 mg by mouth at bedtime.   Yes [provider]  pantoprazole (PROTONIX) 40 MG tablet Take 40 mg by mouth 2 (two) times daily.  05/23/20  Yes [provider]  rOPINIRole (REQUIP) 4 MG tablet Take 4 mg by mouth at bedtime.  12/20/16  Yes [provider]  traZODone (DESYREL) 100 MG tablet Take 300 mg by mouth at bedtime.  12/20/16  Yes [provider]  vitamin B-12 (CYANOCOBALAMIN) 1000 MCG tablet Take 1,000 mcg by mouth in the morning.    Yes [provider]    Allergies    Erythromycin and Other  Review of Systems   Review of Systems Ten systems are reviewed and are negative for acute change except as noted in the HPI  Physical Exam Updated Vital Signs BP (!) 143/86   Pulse 98   Temp 98.7 F (37.1 C)   Resp (!) 21   Ht  (1.676 m)   SpO2 94%    BMI 30.67 kg/m   Physical Exam Constitutional:      General: She is not in acute distress.    Appearance: Normal appearance. She is well-developed. She is not ill-appearing or diaphoretic.  HENT:     Head: Normocephalic and atraumatic.  Eyes:     General: Vision grossly intact. Gaze aligned appropriately.     Pupils: Pupils are equal, round, and reactive to light.  Neck:     Trachea: Trachea and phonation normal.  Cardiovascular:     Rate and Rhythm: Normal rate and regular rhythm.  Pulmonary:     Effort: Pulmonary effort is normal. No accessory muscle usage or respiratory distress.     Comments: Coarse lung sounds bilaterally Abdominal:     General: There is no distension.     Palpations: Abdomen is soft.     Tenderness: There is no abdominal tenderness. There is no guarding or rebound.  Musculoskeletal:        General: Normal range of motion.     Cervical back: Normal range of motion.     Right lower leg: No tenderness. No edema.     Left lower leg: No tenderness. No edema.  Skin:    General: Skin is warm and dry.  Neurological:     Mental Status: She is alert.     GCS: GCS eye subscore is 4. GCS verbal subscore is 5. GCS motor subscore is 6.     Comments: Speech is clear and goal oriented, follows commands Major Cranial nerves without deficit, no facial droop Moves extremities without ataxia, coordination intact  Psychiatric:        Behavior: Behavior normal.     ED Results / Procedures / Treatments   Labs (all labs ordered are listed, but only abnormal results are displayed) Labs Reviewed  BASIC METABOLIC PANEL - Abnormal; Notable for the following components:      Result Value   Sodium 134 (*)    Chloride 94 (*)    Glucose, Bld 117 (*)    All other components within normal limits  CBC WITH DIFFERENTIAL/PLATELET - Abnormal; Notable for the following components:   RBC 3.82 (*)    MCV 101.0 (*)    Lymphs Abs 0.6 (*)    Monocytes Absolute 1.3 (*)    All other  components within normal limits  D-DIMER, QUANTITATIVE (NOT AT [facility]) - Abnormal; Notable for the following components:   D-Dimer, Quant 0.76 (*)    All other components within normal limits  SARS CORONAVIRUS 2 BY RT PCR (HOSPITAL ORDER, PERFORMED IN [facility])  BRAIN NATRIURETIC PEPTIDE  TROPONIN I (HIGH SENSITIVITY)  TROPONIN I (HIGH SENSITIVITY)    EKG EKG Interpretation  Date/Time:  Monday June 13 2020 12:09:21 EDT Ventricular Rate:  99 PR Interval:  146 QRS Duration: 90 QT Interval:  344 QTC Calculation: 441 R Axis:   -38 Text Interpretation: Normal sinus rhythm Left axis deviation Left ventricular hypertrophy ( R in aVL , Cornell product , Romhilt-Estes ) Nonspecific ST abnormality Abnormal ECG Confirmed by Bethann Berkshire [phone]) on 06/13/2020 3:24:25 PM   Radiology CT Angio Chest PE W and/or Wo Contrast  Result Date: 06/13/2020 CLINICAL DATA:  69 year old female with acute shortness of breath and elevated D-dimer. EXAM: CT ANGIOGRAPHY CHEST WITH CONTRAST TECHNIQUE: Multidetector CT imaging of the chest was performed using the standard protocol during bolus administration of intravenous contrast. Multiplanar CT image reconstructions and MIPs were obtained to evaluate the vascular anatomy. CONTRAST:  40mL OMNIPAQUE IOHEXOL 350 MG/ML SOLN COMPARISON:  06/13/2020 chest radiograph. 02/25/2019 thoracic spine CT FINDINGS: Cardiovascular: This is a technically adequate study but respiratory motion artifact decreases sensitivity in the LOWER lungs. No definite pulmonary emboli are identified. Cardiomegaly is noted. No thoracic aortic aneurysm or pericardial effusion identified. Mediastinum/Nodes: No enlarged mediastinal, hilar, or axillary lymph nodes. Multiple shotty hilar and mediastinal lymph nodes are present. Thyroid gland, trachea, and esophagus  demonstrate no significant findings. Lungs/Pleura: Diffuse bilateral ground-glass opacities are noted bilaterally, greatest in  the UPPER lobes. No discrete mass, pleural effusion or pneumothorax noted. Upper Abdomen: Diffuse hepatic steatosis noted. A moderate hiatal hernia is identified. Musculoskeletal: No acute or suspicious bony abnormalities are identified. A thoracic cord stimulator is present. A RIGHT breast prosthesis and LEFT breast surgical changes noted. Review of the MIP images confirms the above findings. IMPRESSION: 1. No evidence of pulmonary emboli or thoracic aortic aneurysm. 2. Diffuse bilateral ground-glass opacities, greatest in the UPPER lobes, nonspecific but suspicious for infection/pneumonia. Clinical/radiographic follow-up is recommended. 3. Cardiomegaly. 4. Hepatic steatosis. 5. Moderate hiatal hernia. Electronically Signed   By: Harmon Pier M.D.   On: 06/13/2020 17:57   DG Chest Port 1 View  Result Date: 06/13/2020 CLINICAL DATA:  Shortness of breath, lungs burning, onset of symptoms 2 weeks ago, COVID-19 test results pending; history asthma, chronic respiratory failure EXAM: PORTABLE CHEST 1 VIEW COMPARISON:  Portable exam 1235 hours without priors for comparison FINDINGS: Intraspinal stimulator projects over lower thoracic spine. Normal heart size and pulmonary vascularity. Moderate-sized hiatal hernia. Patchy infiltrates throughout both lungs greatest in RIGHT upper lobe favor pneumonia. No pleural effusion or pneumothorax. IMPRESSION: Patchy BILATERAL pulmonary infiltrates greatest in RIGHT upper lobe, favor pneumonia. Hiatal hernia. Electronically Signed   By: Ulyses Southward M.D.   On: 06/13/2020 12:52    Procedures .Critical Care Performed by: Bill Salinas, PA-C Authorized by: Bill Salinas, PA-C   Critical care provider statement:    Critical care time (minutes):  31   Critical care was necessary to treat or prevent imminent or life-threatening deterioration of the following conditions:  Respiratory failure   Critical care was time spent personally by me on the following activities:   Discussions with consultants, evaluation of patient's response to treatment, examination of patient, ordering and performing treatments and interventions, ordering and review of laboratory studies, ordering and review of radiographic studies, pulse oximetry, re-evaluation of patient's condition, obtaining history from patient or surrogate, review of old charts and development of treatment plan with patient or surrogate   (including critical care time)  Medications Ordered in ED Medications  sodium chloride 0.9 % bolus 1,000 mL (0 mLs Intravenous Stopped 06/13/20 1806)  cefTRIAXone (ROCEPHIN) 1 g in sodium chloride 0.9 % 100 mL IVPB (0 g Intravenous Stopped 06/13/20 1647)  azithromycin (ZITHROMAX) 500 mg in sodium chloride 0.9 % 250 mL IVPB (0 mg Intravenous Stopped 06/13/20 1746)  iohexol (OMNIPAQUE) 350 MG/ML injection 75 mL (75 mLs Intravenous Contrast Given 06/13/20 1722)    ED Course  I have reviewed the triage vital signs and the nursing notes.  Pertinent labs & imaging results that were available during my care of the patient were reviewed by me and considered in my medical decision making (see chart for details).    MDM Rules/Calculators/A&P                          Additional history obtained from: 1. Nursing notes from this visit. 2. Electronic medical record. ------------------------------------- I ordered, reviewed and interpreted labs which include: BNP within normal limits. Covid test negative. High-sensitivity troponin within normal limits. D-dimer elevated at 0.76, CT ordered. CBC without anemia or leukocytosis. BMP without emergent electrolyte derangement, AKI or gap.  EKG: Normal sinus rhythm Left axis deviation Left ventricular hypertrophy ( R in aVL , Cornell product , Romhilt-Estes ) Nonspecific ST abnormality Abnormal ECG Confirmed by Estell Harpin,  Jomarie LongsJoseph [phone]) on 06/13/2020 3:24:25 PM  CXR:  IMPRESSION:  Patchy BILATERAL pulmonary infiltrates greatest in RIGHT upper  lobe,  favor pneumonia.    Hiatal hernia.   CT Angio PE Study:  IMPRESSION:  1. No evidence of pulmonary emboli or thoracic aortic aneurysm.  2. Diffuse bilateral ground-glass opacities, greatest in the UPPER  lobes, nonspecific but suspicious for infection/pneumonia.  Clinical/radiographic follow-up is recommended.  3. Cardiomegaly.  4. Hepatic steatosis.  5. Moderate hiatal hernia.  - Patient reassessed she is resting comfortably in bed no acute distress.  Patient SPO2 dropped to 93% on 3 L nasal cannula.  Nasal cannula was increased to 4 L/min.  She has new O2 requirement and evidence of multifocal pneumonia, which she will need admission to the hospital for further treatment.  Antibiotics for community-acquired pneumonia started, patient does not meet SIRS/sepsis criteria. Patient is agreeable for admission.  Consult placed to hospitalist service. ---- Care handoff given to Tammy Triplett PA-C at shift change, awaiting call from hospitalist service.  Plan of care is to consult them for admission.  Note: Portions of this report may have been transcribed using voice recognition software. Every effort was made to ensure accuracy; however, inadvertent computerized transcription errors may still be present. Final Clinical Impression(s) / ED Diagnoses Final diagnoses:  Acute on chronic respiratory failure with hypoxia [facility](HCC)  Multifocal pneumonia  Community acquired pneumonia, unspecified laterality    Rx / DC Orders ED Discharge Orders    None       Elizabeth PalauMorelli, Orlanda Lemmerman A, PA-C 06/13/20 1854    Bethann BerkshireZammit, Joseph, MD 06/13/20 2037

## 2020-06-13 NOTE — ED Notes (Signed)
Pt is a difficult stick. 20g IV attempted x 2 by L. Cardwell RN unsuccessful.

## 2020-06-14 ENCOUNTER — Inpatient Hospital Stay (HOSPITAL_COMMUNITY): Payer: Medicare Other

## 2020-06-14 DIAGNOSIS — I10 Essential (primary) hypertension: Secondary | ICD-10-CM

## 2020-06-14 DIAGNOSIS — R0602 Shortness of breath: Secondary | ICD-10-CM

## 2020-06-14 DIAGNOSIS — J9621 Acute and chronic respiratory failure with hypoxia: Secondary | ICD-10-CM

## 2020-06-14 DIAGNOSIS — J441 Chronic obstructive pulmonary disease with (acute) exacerbation: Secondary | ICD-10-CM

## 2020-06-14 LAB — COMPREHENSIVE METABOLIC PANEL
ALT: 27 U/L (ref 0–44)
AST: 23 U/L (ref 15–41)
Albumin: 3.3 g/dL — ABNORMAL LOW (ref 3.5–5.0)
Alkaline Phosphatase: 94 U/L (ref 38–126)
Anion gap: 14 (ref 5–15)
BUN: 10 mg/dL (ref 8–23)
CO2: 26 mmol/L (ref 22–32)
Calcium: 9.3 mg/dL (ref 8.9–10.3)
Chloride: 99 mmol/L (ref 98–111)
Creatinine, Ser: 0.61 mg/dL (ref 0.44–1.00)
GFR calc Af Amer: >60 mL/min (ref >60)
GFR calc non Af Amer: >60 mL/min (ref >60)
Glucose, Bld: 206 mg/dL — ABNORMAL HIGH (ref 70–99)
Potassium: 3.7 mmol/L (ref 3.5–5.1)
Sodium: 139 mmol/L (ref 135–145)
Total Bilirubin: 0.7 mg/dL (ref 0.3–1.2)
Total Protein: 6.9 g/dL (ref 6.5–8.1)

## 2020-06-14 LAB — ECHOCARDIOGRAM COMPLETE
AR max vel: 2.85 cm2
AV Area VTI: 2.66 cm2
AV Area mean vel: 2.34 cm2
AV Mean grad: 4.5 mmHg
AV Peak grad: 8.3 mmHg
Ao pk vel: 1.44 m/s
Area-P 1/2: 2.61 cm2
Height: 66 in
S' Lateral: 3.63 cm

## 2020-06-14 LAB — HIV ANTIBODY (ROUTINE TESTING W REFLEX): HIV Screen 4th Generation wRfx: NONREACTIVE

## 2020-06-14 LAB — CBC
HCT: 37.7 % (ref 36.0–46.0)
Hemoglobin: 12.2 g/dL (ref 12.0–15.0)
MCH: 33.1 pg (ref 26.0–34.0)
MCHC: 32.4 g/dL (ref 30.0–36.0)
MCV: 102.2 fL — ABNORMAL HIGH (ref 80.0–100.0)
Platelets: 200 10*3/uL (ref 150–400)
RBC: 3.69 MIL/uL — ABNORMAL LOW (ref 3.87–5.11)
RDW: 12.7 % (ref 11.5–15.5)
WBC: 5.4 10*3/uL (ref 4.0–10.5)
nRBC: 0 % (ref 0.0–0.2)

## 2020-06-14 MED ORDER — SODIUM CHLORIDE 0.9 % IV SOLN
500.0000 mg | INTRAVENOUS | Status: DC
  Administered 2020-06-14 – 2020-06-18 (×5): 500 mg via INTRAVENOUS
  Filled 2020-06-14 (×5): qty 500

## 2020-06-14 MED ORDER — METHYLPREDNISOLONE SODIUM SUCC 125 MG IJ SOLR
60.0000 mg | Freq: Four times a day (QID) | INTRAMUSCULAR | Status: DC
  Administered 2020-06-14 – 2020-06-18 (×17): 60 mg via INTRAVENOUS
  Filled 2020-06-14 (×18): qty 2

## 2020-06-14 NOTE — ED Notes (Signed)
Ice pack given for infiltration of right arm

## 2020-06-14 NOTE — Progress Notes (Signed)
*  PRELIMINARY RESULTS* Echocardiogram 2D Echocardiogram has been performed.  Stacey Drain 06/14/2020, 1:04 PM

## 2020-06-14 NOTE — Progress Notes (Signed)
Patient ID: Melissa Burke, female   DOB: August 23, 1951, 69 y.o.   MRN: [id number]  PROGRESS NOTE    Melissa Burke  JXB:[id number] DOB: August 23, 1951 DOA: 06/13/2020 PCP: Phill MyronShabbir, Zonaira, MD   Brief Narrative:  69 year old female with history of chronic hypoxic respiratory failure secondary to obstructive sleep apnea on trilogy at home, recurrent admissions in the past secondary to pneumonia requiring intubations, chronic pain syndrome, hypertension, Raynaud's disease, chronic anemia, restless leg syndrome status post spinal cord stimulator presented with worsening shortness of breath/generalized weakness along with fevers and chills.  On presentation, COVID-19 testing was negative x2.  CTA chest showed evidence of multifocal pneumonia.  She was started on IV antibiotics.  Assessment & Plan:   Acute on chronic hypoxic respiratory failure Multifocal pneumonia Unclear if the patient has COPD with exacerbation Obstructive sleep apnea -Patient uses trilogy at home for obstructive sleep apnea and [place] pulmonary [place] -Presented with worsening shortness of breath with fevers and chills for the last 2 weeks.  COVID-19 testing x2 were negative.  CTA chest showed evidence of multifocal pneumonia -Continue Rocephin and Zithromax.  Patient states that she has had history of intubations in the past and would end up getting high-dose of steroids. -Unclear if the patient has COPD with exacerbation.  Will switch Decadron to Solu-Medrol 60 mg IV every 6 hours for today.  And budesonide nebulizer.  Continue duo nebs. -Wean off oxygen as able. -will check procalcitonin in a.m.  CRP was 14.1 on admission. -2D echo  Hypertension -Monitor blood pressure.  Continue amlodipine and losartan  History of chronic pain syndrome -Continue home regimen.  Outpatient follow-up  Restless leg syndrome -Continue ropinirole  Mild hyponatremia--resolved  Generalized deconditioning -PT  eval    DVT prophylaxis: Lovenox Code Status: Full Family Communication: None at bedside disposition Plan: Status is: Inpatient  Remains inpatient appropriate because:Inpatient level of care appropriate due to severity of illness   Dispo: The patient is from: Home              Anticipated d/c is to: Home              Anticipated d/c date is: 2 days              Patient currently is not medically stable to d/c.  Still requiring supplemental oxygen and IV antibiotics.   Consultants: None  Procedures: None  Antimicrobials:  Anti-infectives (From admission, onward)   Start     Dose/Rate Route Frequency Ordered Stop   06/14/20 1600  cefTRIAXone (ROCEPHIN) 1 g in sodium chloride 0.9 % 100 mL IVPB        1 g 200 mL/hr over 30 Minutes Intravenous Every 24 hours 06/13/20 2133     06/13/20 1530  cefTRIAXone (ROCEPHIN) 1 g in sodium chloride 0.9 % 100 mL IVPB        1 g 200 mL/hr over 30 Minutes Intravenous  Once 06/13/20 1525 06/13/20 1647   06/13/20 1530  azithromycin (ZITHROMAX) 500 mg in sodium chloride 0.9 % 250 mL IVPB        500 mg 250 mL/hr over 60 Minutes Intravenous  Once 06/13/20 1525 06/13/20 1746      Subjective: Patient seen and examined at bedside.  Feels the same like last night.  Still feels short of breath with exertion with cough.  No overnight fever or vomiting reported.  No current abdominal pain.  Objective: Vitals:   06/14/20 0826 06/14/20 0830 06/14/20 0900  06/14/20 0930  BP:  (!) 149/71 (!) 147/75 140/67  Pulse:  80 91 92  Resp:  (!) 24 (!) 25 (!) 26  Temp:      SpO2: 95% 99% 99% 94%  Height:        Intake/Output Summary (Last 24 hours) at 06/14/2020 0953 Last data filed at 06/14/2020 0011 Gross per 24 hour  Intake 1350 ml  Output 700 ml  Net 650 ml   There were no vitals filed for this visit.  Examination:  General exam: Appears calm and comfortable.  Currently on 3 L oxygen via nasal cannula. Respiratory system: Bilateral decreased breath  sounds at bases with scattered crackles.  Tachypneic Cardiovascular system: S1 & S2 heard, Rate controlled Gastrointestinal system: Abdomen is nondistended, soft and nontender. Normal bowel sounds heard. Extremities: No cyanosis, clubbing; trace lower extremity edema present Central nervous system: Alert and oriented. No focal neurological deficits. Moving extremities Skin: No rashes, lesions or ulcers Psychiatry: Judgement and insight appear normal. Mood & affect appropriate.     Data Reviewed: I have personally reviewed following labs and imaging studies  CBC: Recent Labs  Lab 06/13/20 1431 06/14/20 0314  WBC 7.1 5.4  NEUTROABS 4.9  --   HGB 12.6 12.2  HCT 38.6 37.7  MCV 101.0* 102.2*  PLT 202 200   Basic Metabolic Panel: Recent Labs  Lab 06/13/20 1159 06/14/20 0314  NA 134* 139  K 3.6 3.7  CL 94* 99  CO2 28 26  GLUCOSE 117* 206*  BUN 12 10  CREATININE 0.73 0.61  CALCIUM 9.7 9.3   GFR: CrCl cannot be calculated (Unknown ideal weight.). Liver Function Tests: Recent Labs  Lab 06/14/20 0314  AST 23  ALT 27  ALKPHOS 94  BILITOT 0.7  PROT 6.9  ALBUMIN 3.3*   No results for input(s): LIPASE, AMYLASE in the last 168 hours. No results for input(s): AMMONIA in the last 168 hours. Coagulation Profile: No results for input(s): INR, PROTIME in the last 168 hours. Cardiac Enzymes: No results for input(s): CKTOTAL, CKMB, CKMBINDEX, TROPONINI in the last 168 hours. BNP (last 3 results) No results for input(s): PROBNP in the last 8760 hours. HbA1C: No results for input(s): HGBA1C in the last 72 hours. CBG: No results for input(s): GLUCAP in the last 168 hours. Lipid Profile: No results for input(s): CHOL, HDL, LDLCALC, TRIG, CHOLHDL, LDLDIRECT in the last 72 hours. Thyroid Function Tests: No results for input(s): TSH, T4TOTAL, FREET4, T3FREE, THYROIDAB in the last 72 hours. Anemia Panel: No results for input(s): VITAMINB12, FOLATE, FERRITIN, TIBC, IRON,  RETICCTPCT in the last 72 hours. Sepsis Labs: No results for input(s): PROCALCITON, LATICACIDVEN in the last 168 hours.  Recent Results (from the past 240 hour(s))  SARS Coronavirus 2 by RT PCR (hospital order, performed in [facility] hospital lab) Nasopharyngeal Nasopharyngeal Swab     Status: None   Collection Time: 06/13/20 12:21 PM   Specimen: Nasopharyngeal Swab  Result Value Ref Range Status   SARS Coronavirus 2 NEGATIVE NEGATIVE Final    Comment: (NOTE) SARS-CoV-2 target nucleic acids are NOT DETECTED.  The SARS-CoV-2 RNA is generally detectable in upper and lower respiratory specimens during the acute phase of infection. The lowest concentration of SARS-CoV-2 viral copies this assay can detect is 250 copies / mL. A negative result does not preclude SARS-CoV-2 infection and should not be used as the sole basis for treatment or other patient management decisions.  A negative result may occur with improper specimen collection /  handling, submission of specimen other than nasopharyngeal swab, presence of viral mutation(s) within the areas targeted by this assay, and inadequate number of viral copies (<250 copies / mL). A negative result must be combined with clinical observations, patient history, and epidemiological information.  Fact Sheet for Patients:   BoilerBrush.com.cy  Fact Sheet for Healthcare Providers: https://pope.com/  This test is not yet approved or  cleared by the [place] FDA and has been authorized for detection and/or diagnosis of SARS-CoV-2 by FDA under an Emergency Use Authorization (EUA).  This EUA will remain in effect (meaning this test can be used) for the duration of the COVID-19 declaration under Section 564(b)(1) of the Act, 21 U.S.C. section 360bbb-3(b)(1), unless the authorization is terminated or revoked sooner.  Performed at [facility], [address], [place], [place] [address]   SARS  Coronavirus 2 by RT PCR (hospital order, performed in [facility] hospital lab) Nasopharyngeal Nasopharyngeal Swab     Status: None   Collection Time: 06/13/20  7:02 PM   Specimen: Nasopharyngeal Swab  Result Value Ref Range Status   SARS Coronavirus 2 NEGATIVE NEGATIVE Final    Comment: (NOTE) SARS-CoV-2 target nucleic acids are NOT DETECTED.  The SARS-CoV-2 RNA is generally detectable in upper and lower respiratory specimens during the acute phase of infection. The lowest concentration of SARS-CoV-2 viral copies this assay can detect is 250 copies / mL. A negative result does not preclude SARS-CoV-2 infection and should not be used as the sole basis for treatment or other patient management decisions.  A negative result may occur with improper specimen collection / handling, submission of specimen other than nasopharyngeal swab, presence of viral mutation(s) within the areas targeted by this assay, and inadequate number of viral copies (<250 copies / mL). A negative result must be combined with clinical observations, patient history, and epidemiological information.  Fact Sheet for Patients:   BoilerBrush.com.cy  Fact Sheet for Healthcare Providers: https://pope.com/  This test is not yet approved or  cleared by the [place] FDA and has been authorized for detection and/or diagnosis of SARS-CoV-2 by FDA under an Emergency Use Authorization (EUA).  This EUA will remain in effect (meaning this test can be used) for the duration of the COVID-19 declaration under Section 564(b)(1) of the Act, 21 U.S.C. section 360bbb-3(b)(1), unless the authorization is terminated or revoked sooner.  Performed at [facility], [address], [place], [place] [address]          Radiology Studies: CT Angio Chest PE W and/or Wo Contrast  Result Date: 06/13/2020 CLINICAL DATA:  69 year old female with acute shortness of breath and elevated  D-dimer. EXAM: CT ANGIOGRAPHY CHEST WITH CONTRAST TECHNIQUE: Multidetector CT imaging of the chest was performed using the standard protocol during bolus administration of intravenous contrast. Multiplanar CT image reconstructions and MIPs were obtained to evaluate the vascular anatomy. CONTRAST:  6mL OMNIPAQUE IOHEXOL 350 MG/ML SOLN COMPARISON:  06/13/2020 chest radiograph. 02/25/2019 thoracic spine CT FINDINGS: Cardiovascular: This is a technically adequate study but respiratory motion artifact decreases sensitivity in the LOWER lungs. No definite pulmonary emboli are identified. Cardiomegaly is noted. No thoracic aortic aneurysm or pericardial effusion identified. Mediastinum/Nodes: No enlarged mediastinal, hilar, or axillary lymph nodes. Multiple shotty hilar and mediastinal lymph nodes are present. Thyroid gland, trachea, and esophagus demonstrate no significant findings. Lungs/Pleura: Diffuse bilateral ground-glass opacities are noted bilaterally, greatest in the UPPER lobes. No discrete mass, pleural effusion or pneumothorax noted. Upper Abdomen: Diffuse hepatic steatosis noted. A moderate  hiatal hernia is identified. Musculoskeletal: No acute or suspicious bony abnormalities are identified. A thoracic cord stimulator is present. A RIGHT breast prosthesis and LEFT breast surgical changes noted. Review of the MIP images confirms the above findings. IMPRESSION: 1. No evidence of pulmonary emboli or thoracic aortic aneurysm. 2. Diffuse bilateral ground-glass opacities, greatest in the UPPER lobes, nonspecific but suspicious for infection/pneumonia. Clinical/radiographic follow-up is recommended. 3. Cardiomegaly. 4. Hepatic steatosis. 5. Moderate hiatal hernia. Electronically Signed   By: Harmon Pier M.D.   On: 06/13/2020 17:57   DG Chest Port 1 View  Result Date: 06/13/2020 CLINICAL DATA:  Shortness of breath, lungs burning, onset of symptoms 2 weeks ago, COVID-19 test results pending; history asthma,  chronic respiratory failure EXAM: PORTABLE CHEST 1 VIEW COMPARISON:  Portable exam 1235 hours without priors for comparison FINDINGS: Intraspinal stimulator projects over lower thoracic spine. Normal heart size and pulmonary vascularity. Moderate-sized hiatal hernia. Patchy infiltrates throughout both lungs greatest in RIGHT upper lobe favor pneumonia. No pleural effusion or pneumothorax. IMPRESSION: Patchy BILATERAL pulmonary infiltrates greatest in RIGHT upper lobe, favor pneumonia. Hiatal hernia. Electronically Signed   By: Ulyses Southward M.D.   On: 06/13/2020 12:52        Scheduled Meds: . amLODipine  10 mg Oral Q breakfast  . calcium carbonate  500 mg of elemental calcium Oral BID WC  . celecoxib  200 mg Oral QHS  . cholecalciferol  5,000 Units Oral Daily  . dexamethasone (DECADRON) injection  6 mg Intravenous Q24H  . DULoxetine  60 mg Oral BID  . enoxaparin (LOVENOX) injection  40 mg Subcutaneous Q24H  . ipratropium-albuterol  3 mL Inhalation Q6H  . levothyroxine  112 mcg Oral Q0600  . loratadine  10 mg Oral Daily  . losartan  25 mg Oral Daily  . montelukast  10 mg Oral QHS  . pantoprazole  40 mg Oral BID  . rOPINIRole  4 mg Oral QHS  . traZODone  300 mg Oral QHS   Continuous Infusions: . cefTRIAXone (ROCEPHIN)  IV            Glade Lloyd, MD [facility] 06/14/2020, 9:53 AM

## 2020-06-14 NOTE — ED Notes (Signed)
Pt given breakfast tray

## 2020-06-14 NOTE — Progress Notes (Signed)
Placed patient on CPAP for HS use. FFM, auto CPAP with 3L O2 bleed in.

## 2020-06-14 NOTE — ED Notes (Signed)
Pt given a meal tray, pt requesting yogurt, nutritional services called

## 2020-06-14 NOTE — ED Notes (Signed)
Pt given cup of water 

## 2020-06-15 ENCOUNTER — Other Ambulatory Visit: Payer: Self-pay

## 2020-06-15 DIAGNOSIS — J441 Chronic obstructive pulmonary disease with (acute) exacerbation: Secondary | ICD-10-CM | POA: Diagnosis present

## 2020-06-15 LAB — COMPREHENSIVE METABOLIC PANEL
ALT: 29 U/L (ref 0–44)
AST: 21 U/L (ref 15–41)
Albumin: 3.3 g/dL — ABNORMAL LOW (ref 3.5–5.0)
Alkaline Phosphatase: 101 U/L (ref 38–126)
Anion gap: 15 (ref 5–15)
BUN: 12 mg/dL (ref 8–23)
CO2: 24 mmol/L (ref 22–32)
Calcium: 9.2 mg/dL (ref 8.9–10.3)
Chloride: 97 mmol/L — ABNORMAL LOW (ref 98–111)
Creatinine, Ser: 0.57 mg/dL (ref 0.44–1.00)
GFR calc Af Amer: >60 mL/min (ref >60)
GFR calc non Af Amer: >60 mL/min (ref >60)
Glucose, Bld: 342 mg/dL — ABNORMAL HIGH (ref 70–99)
Potassium: 3.9 mmol/L (ref 3.5–5.1)
Sodium: 136 mmol/L (ref 135–145)
Total Bilirubin: 0.7 mg/dL (ref 0.3–1.2)
Total Protein: 6.8 g/dL (ref 6.5–8.1)

## 2020-06-15 LAB — PROCALCITONIN: Procalcitonin: <0.1 ng/mL

## 2020-06-15 LAB — CBC WITH DIFFERENTIAL/PLATELET
Abs Immature Granulocytes: 0.01 10*3/uL (ref 0.00–0.07)
Basophils Absolute: 0 10*3/uL (ref 0.0–0.1)
Basophils Relative: 0 %
Eosinophils Absolute: 0 10*3/uL (ref 0.0–0.5)
Eosinophils Relative: 0 %
HCT: 37.2 % (ref 36.0–46.0)
Hemoglobin: 12.2 g/dL (ref 12.0–15.0)
Immature Granulocytes: 0 %
Lymphocytes Relative: 4 %
Lymphs Abs: 0.2 10*3/uL — ABNORMAL LOW (ref 0.7–4.0)
MCH: 33.2 pg (ref 26.0–34.0)
MCHC: 32.8 g/dL (ref 30.0–36.0)
MCV: 101.1 fL — ABNORMAL HIGH (ref 80.0–100.0)
Monocytes Absolute: 0.2 10*3/uL (ref 0.1–1.0)
Monocytes Relative: 4 %
Neutro Abs: 4.9 10*3/uL (ref 1.7–7.7)
Neutrophils Relative %: 92 %
Platelets: 206 10*3/uL (ref 150–400)
RBC: 3.68 MIL/uL — ABNORMAL LOW (ref 3.87–5.11)
RDW: 12.3 % (ref 11.5–15.5)
WBC: 5.3 10*3/uL (ref 4.0–10.5)
nRBC: 0.9 % — ABNORMAL HIGH (ref 0.0–0.2)

## 2020-06-15 LAB — GLUCOSE, CAPILLARY
Glucose-Capillary: 343 mg/dL — ABNORMAL HIGH (ref 70–99)
Glucose-Capillary: 176 mg/dL — ABNORMAL HIGH (ref 70–99)

## 2020-06-15 LAB — CBG MONITORING, ED: Glucose-Capillary: 367 mg/dL — ABNORMAL HIGH (ref 70–99)

## 2020-06-15 LAB — MAGNESIUM: Magnesium: 2 mg/dL (ref 1.7–2.4)

## 2020-06-15 MED ORDER — INSULIN DETEMIR 100 UNIT/ML ~~LOC~~ SOLN
10.0000 [IU] | Freq: Every day | SUBCUTANEOUS | Status: DC
  Administered 2020-06-15 – 2020-06-19 (×5): 10 [IU] via SUBCUTANEOUS
  Filled 2020-06-15 (×6): qty 0.1

## 2020-06-15 MED ORDER — INSULIN ASPART 100 UNIT/ML ~~LOC~~ SOLN
0.0000 [IU] | Freq: Three times a day (TID) | SUBCUTANEOUS | Status: DC
  Administered 2020-06-15: 7 [IU] via SUBCUTANEOUS
  Administered 2020-06-15: 9 [IU] via SUBCUTANEOUS
  Administered 2020-06-16: 3 [IU] via SUBCUTANEOUS
  Administered 2020-06-16: 5 [IU] via SUBCUTANEOUS
  Administered 2020-06-16: 3 [IU] via SUBCUTANEOUS
  Administered 2020-06-17: 5 [IU] via SUBCUTANEOUS
  Administered 2020-06-17: 3 [IU] via SUBCUTANEOUS
  Administered 2020-06-17: 7 [IU] via SUBCUTANEOUS
  Administered 2020-06-18: 5 [IU] via SUBCUTANEOUS
  Administered 2020-06-18 (×2): 7 [IU] via SUBCUTANEOUS
  Administered 2020-06-19 (×2): 5 [IU] via SUBCUTANEOUS
  Filled 2020-06-15: qty 1

## 2020-06-15 NOTE — Progress Notes (Signed)
PROGRESS NOTE  Melissa Burke IRJ:[id number] DOB: [birth year] DOA: 06/13/2020 PCP: Phill Myron, MD   LOS: 2 days   Brief Narrative / Interim history: 69 year old female with history of chronic hypoxic respiratory failure, OSA, home [facility], recurrent admissions in the past secondary to pneumonia requiring intubations, chronic pain syndrome, hypertension, Raynaud's disease, chronic anemia, restless leg syndrome status post spinal cord stimulator came in with worsening shortness of breath, generalized weakness along with fever and chills.  She underwent a CT angiogram on admission which showed evidence of multifocal pneumonia.  COVID-19 testing was negative x2  Subjective / 24h Interval events: She is feeling minimally improved but not yet at baseline, continues to have significant shortness of breath with minimal activity.  Also complaining of a dry cough and chest tightness with deep breathing  Assessment & Plan: Principal Problem Acute on chronic hypoxic respiratory failure, multifocal pneumonia, possible COPD exacerbation, underlying OSA -patient has [facility] at home for OSA and follows up with pulmonary as an outpatient and will.  Covid testing was negative x2, CT angiogram on admission showed evidence of multifocal pneumonia.  She was started on ceftriaxone and azithromycin, continue.  She was also placed on nebulizers, steroids, continue. -Underwent a 2D echo which showed EF 60-65%, no WMA, grade 1 DD, normal RV  Active Problems Essential hypertension -continue amlodipine, losartan, monitor BP  Chronic pain syndrome-continue home regimen  Restless leg syndrome-continue ropinirole  Mild hyponatremia-resolved  General deconditioning-PT recommends SNF to which patient is agreeable.  SW consulted  Scheduled Meds: . amLODipine  10 mg Oral Q breakfast  . calcium carbonate  500 mg of elemental calcium Oral BID WC  . celecoxib  200 mg Oral QHS  . cholecalciferol  5,000 Units Oral  Daily  . DULoxetine  60 mg Oral BID  . enoxaparin (LOVENOX) injection  40 mg Subcutaneous Q24H  . ipratropium-albuterol  3 mL Inhalation Q6H  . levothyroxine  112 mcg Oral Q0600  . loratadine  10 mg Oral Daily  . losartan  25 mg Oral Daily  . methylPREDNISolone (SOLU-MEDROL) injection  60 mg Intravenous Q6H  . montelukast  10 mg Oral QHS  . pantoprazole  40 mg Oral BID  . rOPINIRole  4 mg Oral QHS  . traZODone  300 mg Oral QHS   Continuous Infusions: . azithromycin Stopped (06/14/20 1721)  . cefTRIAXone (ROCEPHIN)  IV Stopped (06/14/20 1859)   PRN Meds:.acetaminophen **OR** acetaminophen, guaiFENesin, HYDROcodone-acetaminophen, ipratropium-albuterol, menthol-cetylpyridinium, methocarbamol, senna-docusate  Diet Orders (From admission, onward)    Start     Ordered   06/13/20 2127  Diet 2 gram sodium Room service appropriate? Yes; Fluid consistency: Thin  Diet effective now       Question Answer Comment  Room service appropriate? Yes   Fluid consistency: Thin      06/13/20 2130          DVT prophylaxis: enoxaparin (LOVENOX) injection 40 mg Start: 06/13/20 2200 SCDs Start: 06/13/20 2126     Code Status: Full Code  Family Communication: no family at bedside   Status is: Inpatient  Remains inpatient appropriate because:Inpatient level of care appropriate due to severity of illness   Dispo: The patient is from: Home              Anticipated d/c is to: SNF              Anticipated d/c date is: 2 days              Patient currently is  not medically stable to d/c.  Consultants:  None   Procedures:  2D echo  Microbiology  None   Antimicrobials: Ceftriaxone 8/30 >> Azithromycin 8/30 >>    Objective: Vitals:   06/15/20 0730 06/15/20 0741 06/15/20 0800 06/15/20 0815  BP: (!) 162/90 (!) 172/93 (!) 158/83   Pulse: 95 (!) 105 97   Resp: 18 20  20   Temp:  98 F (36.7 C)    TempSrc:  Oral    SpO2: 97% 95% 97%   Height:        Intake/Output Summary (Last 24  hours) at 06/15/2020 1102 Last data filed at 06/15/2020 0300 Gross per 24 hour  Intake 350 ml  Output 2000 ml  Net -1650 ml   There were no vitals filed for this visit.  Examination:  Constitutional: NAD Eyes: no scleral icterus ENMT: Mucous membranes are moist.  Neck: normal, supple Respiratory: Faint end expiratory wheezing, no crackles.  Tachypneic at times Cardiovascular: Regular rate and rhythm, no murmurs / rubs / gallops. No LE edema. Abdomen: non distended, no tenderness. Bowel sounds positive.  Musculoskeletal: no clubbing / cyanosis.  Skin: no rashes Neurologic: CN 2-12 grossly intact. Strength 5/5 in all 4.   Data Reviewed: I have independently reviewed following labs and imaging studies   CBC: Recent Labs  Lab 06/13/20 1431 06/14/20 0314 06/15/20 0316  WBC 7.1 5.4 5.3  NEUTROABS 4.9  --  4.9  HGB 12.6 12.2 12.2  HCT 38.6 37.7 37.2  MCV 101.0* 102.2* 101.1*  PLT 202 200 206   Basic Metabolic Panel: Recent Labs  Lab 06/13/20 1159 06/14/20 0314 06/15/20 0316  NA 134* 139 136  K 3.6 3.7 3.9  CL 94* 99 97*  CO2 28 26 24   GLUCOSE 117* 206* 342*  BUN 12 10 12   CREATININE 0.73 0.61 0.57  CALCIUM 9.7 9.3 9.2  MG  --   --  2.0   Liver Function Tests: Recent Labs  Lab 06/14/20 0314 06/15/20 0316  AST 23 21  ALT 27 29  ALKPHOS 94 101  BILITOT 0.7 0.7  PROT 6.9 6.8  ALBUMIN 3.3* 3.3*   Coagulation Profile: No results for input(s): INR, PROTIME in the last 168 hours. HbA1C: No results for input(s): HGBA1C in the last 72 hours. CBG: No results for input(s): GLUCAP in the last 168 hours.  Recent Results (from the past 240 hour(s))  SARS Coronavirus 2 by RT PCR (hospital order, performed in [facility] hospital lab) Nasopharyngeal Nasopharyngeal Swab     Status: None   Collection Time: 06/13/20 12:21 PM   Specimen: Nasopharyngeal Swab  Result Value Ref Range Status   SARS Coronavirus 2 NEGATIVE NEGATIVE Final    Comment: (NOTE) SARS-CoV-2 target  nucleic acids are NOT DETECTED.  The SARS-CoV-2 RNA is generally detectable in upper and lower respiratory specimens during the acute phase of infection. The lowest concentration of SARS-CoV-2 viral copies this assay can detect is 250 copies / mL. A negative result does not preclude SARS-CoV-2 infection and should not be used as the sole basis for treatment or other patient management decisions.  A negative result may occur with improper specimen collection / handling, submission of specimen other than nasopharyngeal swab, presence of viral mutation(s) within the areas targeted by this assay, and inadequate number of viral copies (<250 copies / mL). A negative result must be combined with clinical observations, patient history, and epidemiological information.  Fact Sheet for Patients:   08/15/20  Fact Sheet for Healthcare  Providers: https://pope.com/  This test is not yet approved or  cleared by the [place] and has been authorized for detection and/or diagnosis of SARS-CoV-2 by FDA under an Emergency Use Authorization (EUA).  This EUA will remain in effect (meaning this test can be used) for the duration of the COVID-19 declaration under Section 564(b)(1) of the Act, 21 U.S.C. section 360bbb-3(b)(1), unless the authorization is terminated or revoked sooner.  Performed at [facility], [address], [place], [place] [address]   SARS Coronavirus 2 by RT PCR (hospital order, performed in [facility] hospital lab) Nasopharyngeal Nasopharyngeal Swab     Status: None   Collection Time: 06/13/20  7:02 PM   Specimen: Nasopharyngeal Swab  Result Value Ref Range Status   SARS Coronavirus 2 NEGATIVE NEGATIVE Final    Comment: (NOTE) SARS-CoV-2 target nucleic acids are NOT DETECTED.  The SARS-CoV-2 RNA is generally detectable in upper and lower respiratory specimens during the acute phase of infection. The  lowest concentration of SARS-CoV-2 viral copies this assay can detect is 250 copies / mL. A negative result does not preclude SARS-CoV-2 infection and should not be used as the sole basis for treatment or other patient management decisions.  A negative result may occur with improper specimen collection / handling, submission of specimen other than nasopharyngeal swab, presence of viral mutation(s) within the areas targeted by this assay, and inadequate number of viral copies (<250 copies / mL). A negative result must be combined with clinical observations, patient history, and epidemiological information.  Fact Sheet for Patients:   BoilerBrush.com.cy  Fact Sheet for Healthcare Providers: https://pope.com/  This test is not yet approved or  cleared by the [place] FDA and has been authorized for detection and/or diagnosis of SARS-CoV-2 by FDA under an Emergency Use Authorization (EUA).  This EUA will remain in effect (meaning this test can be used) for the duration of the COVID-19 declaration under Section 564(b)(1) of the Act, 21 U.S.C. section 360bbb-3(b)(1), unless the authorization is terminated or revoked sooner.  Performed at [facility], [address], [place], [place] [address]      Radiology Studies: ECHOCARDIOGRAM COMPLETE  Result Date: 06/14/2020    ECHOCARDIOGRAM REPORT   Patient Name:   Melissa Burke Date of Exam: 06/14/2020 Medical Rec #:  [id number]        Height:       66.0 in Accession #:    [id number]       Weight:       190.0 lb Date of Birth:  1951/02/25        BSA:          1.957 m Patient Age:    69 years         BP:           137/73 mmHg Patient Gender: F                HR:           91 bpm. [facility] Procedure: 2D Echo, Cardiac Doppler and Color Doppler Indications:    Dyspnea 786.09 / R06.00  History:        Patient has no prior history of Echocardiogram examinations.                 Risk  Factors:Hypertension. Chronic respiratory failure (HCC)                 (From Hx), Sleep apnea (From Hx).  Sonographer:    Celesta GentileBernard White RCS Referring Phys: [id number] [facility] IMPRESSIONS  1. Left ventricular ejection fraction, by estimation, is 60 to 65%. The left ventricle has normal function. The left ventricle has no regional wall motion abnormalities. Left ventricular diastolic parameters are consistent with Grade I diastolic dysfunction (impaired relaxation).  2. Right ventricular systolic function is normal. The right ventricular size is normal. There is normal pulmonary artery systolic pressure.  3. Left atrial size was severely dilated.  4. The mitral valve is normal in structure. Trivial mitral valve regurgitation. No evidence of mitral stenosis.  5. The aortic valve is tricuspid. Aortic valve regurgitation is not visualized. No aortic stenosis is present.  6. The inferior vena cava is dilated in size with >50% respiratory variability, suggesting right atrial pressure of 8 mmHg. FINDINGS  Left Ventricle: Left ventricular ejection fraction, by estimation, is 60 to 65%. The left ventricle has normal function. The left ventricle has no regional wall motion abnormalities. The left ventricular internal cavity size was normal in size. There is  no left ventricular hypertrophy. Left ventricular diastolic parameters are consistent with Grade I diastolic dysfunction (impaired relaxation). Normal left ventricular filling pressure. Right Ventricle: The right ventricular size is normal. No increase in right ventricular wall thickness. Right ventricular systolic function is normal. There is normal pulmonary artery systolic pressure. The tricuspid regurgitant velocity is 2.37 m/s, and  with an assumed right atrial pressure of 8 mmHg, the estimated right ventricular systolic pressure is 30.5 mmHg. Left Atrium: Left atrial size was severely dilated. Right Atrium: Right atrial size was normal in size. Pericardium: There  is no evidence of pericardial effusion. Mitral Valve: The mitral valve is normal in structure. Trivial mitral valve regurgitation. No evidence of mitral valve stenosis. Tricuspid Valve: The tricuspid valve is normal in structure. Tricuspid valve regurgitation is trivial. No evidence of tricuspid stenosis. Aortic Valve: The aortic valve is tricuspid. Aortic valve regurgitation is not visualized. No aortic stenosis is present. Aortic valve mean gradient measures 4.5 mmHg. Aortic valve peak gradient measures 8.3 mmHg. Aortic valve area, by VTI measures 2.66 cm. Pulmonic Valve: The pulmonic valve was not well visualized. Pulmonic valve regurgitation is not visualized. No evidence of pulmonic stenosis. Aorta: The aortic root is normal in size and structure. Venous: The inferior vena cava is dilated in size with greater than 50% respiratory variability, suggesting right atrial pressure of 8 mmHg. IAS/Shunts: No atrial level shunt detected by color flow Doppler.  LEFT VENTRICLE PLAX 2D LVIDd:         5.30 cm  Diastology LVIDs:         3.62 cm  LV e' lateral:   9.46 cm/s LV PW:         0.84 cm  LV E/e' lateral: 7.8 LV IVS:        0.98 cm  LV e' medial:    8.38 cm/s LVOT diam:     2.00 cm  LV E/e' medial:  8.8 LV SV:         79 LV SV Index:   40 LVOT Area:     3.14 cm  RIGHT VENTRICLE RV S prime:     12.50 cm/s TAPSE (M-mode): 2.3 cm LEFT ATRIUM              Index       RIGHT ATRIUM           Index LA diam:        3.10 cm  1.58 cm/m  RA Area:     14.40 cm LA Vol (A2C):   62.4 ml  31.89 ml/m RA Volume:   34.80 ml  17.79 ml/m LA Vol (A4C):   106.0 ml 54.17 ml/m LA Biplane Vol: 83.6 ml  42.73 ml/m  AORTIC VALVE AV Area (Vmax):    2.85 cm AV Area (Vmean):   2.34 cm AV Area (VTI):     2.66 cm AV Vmax:           144.29 cm/s AV Vmean:          98.732 cm/s AV VTI:            0.295 m AV Peak Grad:      8.3 mmHg AV Mean Grad:      4.5 mmHg LVOT Vmax:         131.00 cm/s LVOT Vmean:        73.500 cm/s LVOT VTI:           0.250 m LVOT/AV VTI ratio: 0.85  AORTA Ao Root diam: 3.10 cm MITRAL VALVE                TRICUSPID VALVE MV Area (PHT): 2.61 cm     TR Peak grad:   22.5 mmHg MV Decel Time: 291 msec     TR Vmax:        237.00 cm/s MV E velocity: 73.70 cm/s MV A velocity: 105.00 cm/s  SHUNTS MV E/A ratio:  0.70         Systemic VTI:  0.25 m                             Systemic Diam: 2.00 cm Dina Rich MD Electronically signed by Dina Rich MD Signature Date/Time: 06/14/2020/3:02:14 PM    Final     Pamella Pert, MD, PhD [facility]  Between 7 am - 7 pm I am available, please contact me via Amion or Securechat  Between 7 pm - 7 am I am not available, please contact night coverage MD/APP via Amion

## 2020-06-15 NOTE — Plan of Care (Signed)
°  Problem: Acute Rehab PT Goals(only PT should resolve) Goal: Pt Will Go Supine/Side To Sit Outcome: Progressing Flowsheets (Taken 06/15/2020 0950) Pt will go Supine/Side to Sit:  Independently  with modified independence Goal: Patient Will Transfer Sit To/From Stand Outcome: Progressing Flowsheets (Taken 06/15/2020 0950) Patient will transfer sit to/from stand:  Independently  with modified independence Goal: Pt Will Transfer Bed To Chair/Chair To Bed Outcome: Progressing Flowsheets (Taken 06/15/2020 0950) Pt will Transfer Bed to Chair/Chair to Bed: with supervision Goal: Pt Will Ambulate Outcome: Progressing Flowsheets (Taken 06/15/2020 0950) Pt will Ambulate:  100 feet  with supervision   9:51 AM, 06/15/20 Ocie Bob, MPT Physical Therapist with [facility] [phone] office [phone] mobile phone

## 2020-06-15 NOTE — TOC Initial Note (Signed)
Transition of Care [facility]) - Initial/Assessment Note    Patient Details  Name: Melissa Burke MRN: [id number] Date of Birth: 1951/01/05  [facility] CM/SW Contact:    Karn Cassis, LCSW Phone Number: 06/15/2020, 11:25 AM  Clinical Narrative:   Pt admitted due to acute on chronic respiratory failure. PT evaluated pt this morning and recommend SNF. LCSW discussed placement process. Pt is from [place] and states she has a landlord who lives upstairs. She said her landlord just had surgery, so she would have no assistance at home. She feels SNF will be necessary for short-term rehab prior to returning home. Pt indicates she uses [facility] at night. She is open to placement in [place], [place], or [place].                 Expected Discharge Plan: Skilled Nursing Facility Barriers to Discharge: Continued Medical Work up   Patient Goals and CMS Choice Patient states their goals for this hospitalization and ongoing recovery are:: short-term SNF prior to return home CMS Medicare.gov Compare Post Acute Care list provided to:: Patient Choice offered to / list presented to : Patient  Expected Discharge Plan and Services Expected Discharge Plan: Skilled Nursing Facility In-house Referral: Clinical Social Work   Post Acute Care Choice: Skilled Nursing Facility Living arrangements for the past 2 months: Apartment                                      Prior Living Arrangements/Services Living arrangements for the past 2 months: Apartment Lives with:: Self Patient language and need for interpreter reviewed:: Yes Do you feel safe going back to the place where you live?: No (Needs rehab)        Care giver support system in place?: No (comment)   Criminal Activity/Legal Involvement Pertinent to Current Situation/Hospitalization: No - Comment as needed  Activities of Daily Living      Permission Sought/Granted                  Emotional  Assessment Appearance:: Appears stated age Attitude/Demeanor/Rapport: Engaged Affect (typically observed): Accepting Orientation: : Oriented to Self, Oriented to Place, Oriented to  Time, Oriented to Situation Alcohol / Substance Use: Not Applicable    Admission diagnosis:  Pneumonia [J18.9] COPD exacerbation (HCC) [J44.1] Patient Active Problem List   Diagnosis Date Noted  . COPD exacerbation (HCC) 06/15/2020  . Pneumonia 06/13/2020   PCP:  Phill Myron, MD Pharmacy:   [facility] [address] [place] [address] [place] [address] [place] [address] Phone: [phone] Fax: [phone]  [facility] [address] [place], [place] - [place] [address] [place] [address] Phone: [phone] Fax: [phone]     Social Determinants of Health (SDOH) Interventions    Readmission Risk Interventions No flowsheet data found.

## 2020-06-15 NOTE — Evaluation (Signed)
Physical Therapy Evaluation Patient Details Name: Melissa Burke MRN: [id number] DOB: Jul 09, 1951 Today's Date: 06/15/2020   History of Present Illness  Patient is a 69 year old Caucasian female who is up-to-date with her Covid vaccination.  Her medical history is significant for chronic respiratory failure secondary to obstructive sleep apnea on trilogy at home.  She has had recurrent admissions in the past secondary to pneumonia, status post intubations in 2010.  Comorbidities include chronic pain syndrome, hypertension, Raynaud's disease, chronic anemia, restless leg syndrome and status post spinal cord stimulation system     Clinical Impression  Patient demonstrates slow labored cadence with frequent leaning on nearby objects for support due to generalized weakness and fair/poor balance, on 3 LPM O2 with SpO2 dropping from 93% to 78%, patient limited mostly due to c/o fatigue and lung pain.  Patient tolerated sitting up at bedside after therapy.  Patient will benefit from continued physical therapy in hospital and recommended venue below to increase strength, balance, endurance for safe ADLs and gait.    Follow Up Recommendations SNF;Supervision - Intermittent;Supervision for mobility/OOB    Equipment Recommendations  None recommended by PT    Recommendations for Other Services       Precautions / Restrictions Precautions Precautions: Fall Restrictions Weight Bearing Restrictions: No      Mobility  Bed Mobility Overal bed mobility: Modified Independent             General bed mobility comments: increased time, slightly labored movement  Transfers Overall transfer level: Needs assistance Equipment used: 1 person hand held assist;None Transfers: Sit to/from [employer] Sit to Stand: Min guard Stand pivot transfers: Min guard       General transfer comment: increased time, labored movement, frequent leaning on nearby objects for  support  Ambulation/Gait Ambulation/Gait assistance: Min guard;Min assist Gait Distance (Feet): 75 Feet Assistive device: 1 person hand held assist;None Gait Pattern/deviations: Decreased step length - right;Decreased step length - left;Decreased stride length Gait velocity: decreased   General Gait Details: slow labored cadence with frequent leaning on nearby objects for support, decreased arm swing, occasional standing rest breaks, no loss of balance  Stairs            Wheelchair Mobility    Modified Rankin (Stroke Patients Only)       Balance Overall balance assessment: Needs assistance Sitting-balance support: No upper extremity supported;Feet unsupported Sitting balance-Leahy Scale: Good Sitting balance - Comments: seated at EOB   Standing balance support: No upper extremity supported Standing balance-Leahy Scale: Fair Standing balance comment: has to frequently on objects for support when taking steps                             Pertinent Vitals/Pain Pain Assessment: 0-10 Pain Score: 9  Pain Location: lungs when taking deep breaths Pain Descriptors / Indicators: Sore;Tightness Pain Intervention(s): Limited activity within patient's tolerance;Monitored during session    Home Living Family/patient expects to be discharged to:: Private residence Living Arrangements: Alone Available Help at Discharge: Friend(s);Available PRN/intermittently;Other (Comment) (Patient states her land lord is unable to assist her)   Home Access: Level entry     Home Layout: Two level;Other (Comment) (Patient lives in her land lord's basement) Home Equipment: Walker - 2 wheels      Prior Function Level of Independence: Independent         Comments: Tourist information centre manager, drives, does her own shopping     Hand Dominance  Extremity/Trunk Assessment   Upper Extremity Assessment Upper Extremity Assessment: Generalized weakness    Lower Extremity  Assessment Lower Extremity Assessment: Generalized weakness    Cervical / Trunk Assessment Cervical / Trunk Assessment: Normal  Communication   Communication: No difficulties  Cognition Arousal/Alertness: Awake/alert Behavior During Therapy: WFL for tasks assessed/performed Overall Cognitive Status: Within Functional Limits for tasks assessed                                        General Comments      Exercises     Assessment/Plan    PT Assessment Patient needs continued PT services  PT Problem List Decreased strength;Decreased activity tolerance;Decreased balance;Decreased mobility       PT Treatment Interventions DME instruction;Gait training;Stair training;Functional mobility training;Therapeutic activities;Therapeutic exercise;Balance training;Patient/family education    PT Goals (Current goals can be found in the Care Plan section)  Acute Rehab PT Goals Patient Stated Goal: be able to take care of self before returning home PT Goal Formulation: With patient Time For Goal Achievement: 06/29/20    Frequency Min 3X/week   Barriers to discharge        Co-evaluation               AM-PAC PT "6 Clicks" Mobility  Outcome Measure Help needed turning from your back to your side while in a flat bed without using bedrails?: None Help needed moving from lying on your back to sitting on the side of a flat bed without using bedrails?: None Help needed moving to and from a bed to a chair (including a wheelchair)?: A Little Help needed standing up from a chair using your arms (e.g., wheelchair or bedside chair)?: A Little Help needed to walk in hospital room?: A Little Help needed climbing 3-5 steps with a railing? : A Lot 6 Click Score: 19    End of Session Equipment Utilized During Treatment: Oxygen Activity Tolerance: Patient tolerated treatment well;Patient limited by fatigue;Patient limited by pain Patient left: in bed;with call bell/phone  within reach Nurse Communication: Mobility status PT Visit Diagnosis: Unsteadiness on feet (R26.81);Other abnormalities of gait and mobility (R26.89);Muscle weakness (generalized) (M62.81)    Time: 8546-2703 PT Time Calculation (min) (ACUTE ONLY): 24 min   Charges:   PT Evaluation $PT Eval Moderate Complexity: 1 Mod PT Treatments $Therapeutic Activity: 23-37 mins        9:41 AM, 06/15/20 Ocie Bob, MPT Physical Therapist with [facility] 336 [phone] office [phone] mobile phone

## 2020-06-15 NOTE — NC FL2 (Signed)
[name] Birthdate: November 20, 1950 Sex: female Admission Date (Current Location): 06/13/2020  [facility] and [place] Number:  [employer] and Address:  [facility],  [address] [place] [address]      Provider Number: [id number]  Attending Physician Name and Address:  Leatha Gilding, MD  Relative Name and Phone Number:  Melissa Burke (friend) Ph: [phone]    Current Level of Care: Hospital Recommended Level of Care: Skilled Nursing Facility Prior Approval Number:    Date Approved/Denied:   PASRR Number:    Discharge Plan: SNF    Current Diagnoses: Patient Active Problem List   Diagnosis Date Noted  . COPD exacerbation (HCC) 06/15/2020  . Pneumonia 06/13/2020    Orientation RESPIRATION BLADDER Height & Weight     Self, Time, Situation, Place  Other (Comment), O2 (Patient uses Trilogy at night; patient can bring it to the facility; on O2 3L/min) Incontinent, External catheter (Patient currently using Purewick) Weight:   Height:  5\' 6"  (167.6 cm)  BEHAVIORAL SYMPTOMS/MOOD NEUROLOGICAL BOWEL NUTRITION STATUS      Continent Diet (2 gram sodium)  AMBULATORY STATUS COMMUNICATION OF NEEDS Skin   Limited Assist Verbally Normal                       Personal Care Assistance Level of Assistance  Bathing, Feeding, Dressing Bathing Assistance: Limited assistance Feeding assistance: Independent Dressing Assistance: Limited assistance     Functional Limitations Info  Sight, Hearing, Speech Sight Info: Adequate Hearing Info: Adequate Speech Info: Adequate    SPECIAL CARE FACTORS FREQUENCY  PT (By licensed PT)     PT Frequency: 5x's/week              Contractures Contractures Info: Not present    Additional Factors Info  Code Status, Psychotropic, Allergies Code Status Info: Full Allergies Info: Erythromycin Psychotropic Info: Cymbalta  (duloxetine); Desyrel (trazadone)         Current Medications (06/15/2020):  This is the current hospital active medication list Current Facility-Administered Medications  Medication Dose Route Frequency Provider Last Rate Last Admin  . acetaminophen (TYLENOL) tablet 650 mg  650 mg Oral Q6H PRN Acheampong, 08/15/2020, MD       Or  . acetaminophen (TYLENOL) suppository 650 mg  650 mg Rectal Q6H PRN Acheampong, Genice Rouge, MD      . amLODipine (NORVASC) tablet 10 mg  10 mg Oral Q breakfast Acheampong, Genice Rouge, MD   10 mg at 06/15/20 0748  . azithromycin (ZITHROMAX) 500 mg in sodium chloride 0.9 % 250 mL IVPB  500 mg Intravenous Q24H 08/15/20, MD   Stopped at 06/14/20 1721  . calcium carbonate (OS-CAL - dosed in mg of elemental calcium) tablet 500 mg of elemental calcium  500 mg of elemental calcium Oral BID WC Acheampong, 06/16/20, MD   500 mg of elemental calcium at 06/15/20 0943  . cefTRIAXone (ROCEPHIN) 1 g in sodium chloride 0.9 % 100 mL IVPB  1 g Intravenous Q24H Acheampong, 08/15/20, MD   Stopped at 06/14/20 1859  . celecoxib (CELEBREX) capsule 200 mg  200 mg Oral QHS Acheampong, 06/16/20, MD   200 mg at 06/14/20 2226  . cholecalciferol (VITAMIN D3) tablet 5,000 Units  5,000 Units Oral Daily Acheampong, 2227, MD   5,000 Units at 06/15/20 0749  . DULoxetine (CYMBALTA) DR capsule 60  mg  60 mg Oral BID Lilia Pro, MD   60 mg at 06/15/20 0945  . enoxaparin (LOVENOX) injection 40 mg  40 mg Subcutaneous Q24H Acheampong, Genice Rouge, MD   40 mg at 06/14/20 2226  . guaiFENesin (MUCINEX) 12 hr tablet 1,200 mg  1,200 mg Oral BID PRN Acheampong, Genice Rouge, MD      . HYDROcodone-acetaminophen (NORCO) 10-325 MG per tablet 1 tablet  1 tablet Oral QID PRN Acheampong, Genice Rouge, MD   1 tablet at 06/15/20 0436  . ipratropium-albuterol (DUONEB) 0.5-2.5 (3) MG/3ML nebulizer solution 3 mL  3 mL Inhalation Q6H Acheampong, Genice Rouge, MD   3 mL at 06/15/20 0752  . ipratropium-albuterol (DUONEB) 0.5-2.5 (3) MG/3ML  nebulizer solution 3 mL  3 mL Nebulization Q6H PRN Acheampong, Genice Rouge, MD      . levothyroxine (SYNTHROID) tablet 112 mcg  112 mcg Oral Q0600 Lilia Pro, MD   112 mcg at 06/15/20 0641  . loratadine (CLARITIN) tablet 10 mg  10 mg Oral Daily Acheampong, Genice Rouge, MD   10 mg at 06/15/20 0946  . losartan (COZAAR) tablet 25 mg  25 mg Oral Daily Acheampong, Genice Rouge, MD   25 mg at 06/15/20 0751  . menthol-cetylpyridinium (CEPACOL) lozenge 3 mg  1 lozenge Oral PRN Acheampong, Genice Rouge, MD      . methocarbamol (ROBAXIN) tablet 750 mg  750 mg Oral TID PRN Lilia Pro, MD   750 mg at 06/15/20 0436  . methylPREDNISolone sodium succinate (SOLU-MEDROL) 125 mg/2 mL injection 60 mg  60 mg Intravenous Q6H Alekh, Kshitiz, MD   60 mg at 06/15/20 0947  . montelukast (SINGULAIR) tablet 10 mg  10 mg Oral QHS Lilia Pro, MD   10 mg at 06/14/20 2226  . pantoprazole (PROTONIX) EC tablet 40 mg  40 mg Oral BID Lilia Pro, MD   40 mg at 06/15/20 0946  . rOPINIRole (REQUIP) tablet 4 mg  4 mg Oral QHS Acheampong, Genice Rouge, MD   4 mg at 06/14/20 2226  . senna-docusate (Senokot-S) tablet 1 tablet  1 tablet Oral QHS PRN Acheampong, Genice Rouge, MD      . traZODone (DESYREL) tablet 300 mg  300 mg Oral QHS Lilia Pro, MD   300 mg at 06/14/20 2225   Current Outpatient Medications  Medication Sig Dispense Refill  . alendronate (FOSAMAX) 70 MG tablet Take 70 mg by mouth every Monday. Take with a full glass of water on an empty stomach in the morning    . amLODipine (NORVASC) 10 MG tablet Take 10 mg by mouth in the morning.     Marland Kitchen ammonium lactate (AMLACTIN) 12 % cream Apply topically as needed for dry skin.     Marland Kitchen azithromycin (ZITHROMAX) 250 MG tablet Take 1 tablet by mouth every Monday, Wednesday, and Friday. In the morning    . calcium carbonate (OSCAL) 1500 (600 Ca) MG TABS tablet Take 600 mg of elemental calcium by mouth 2 (two) times daily with a meal.    . celecoxib (CELEBREX) 200 MG capsule  Take 200 mg by mouth at bedtime.     . Cholecalciferol (VITAMIN D3) 125 MCG (5000 UT) CAPS Take 1 capsule by mouth in the morning.    . DULoxetine (CYMBALTA) 60 MG capsule Take 60 mg by mouth 2 (two) times daily.     Marland Kitchen HYDROcodone-acetaminophen (NORCO) 10-325 MG tablet Take 1 tablet by mouth 4 (four) times daily as needed for moderate  pain.    . Ipratropium-Albuterol (COMBIVENT RESPIMAT) 20-100 MCG/ACT AERS respimat Take 1 puff by mouth every 6 (six) hours as needed for wheezing or shortness of breath.     Marland Kitchen ipratropium-albuterol (DUONEB) 0.5-2.5 (3) MG/3ML SOLN Take 3 mLs by nebulization every 6 (six) hours as needed (for shortness of breath).    Marland Kitchen levothyroxine (SYNTHROID) 112 MCG tablet Take 112 mcg by mouth in the morning.     . loratadine (CLARITIN) 10 MG tablet Take 10 mg by mouth in the morning.    Marland Kitchen losartan (COZAAR) 25 MG tablet Take 25 mg by mouth in the morning.     . methocarbamol (ROBAXIN) 750 MG tablet Take 750 mg by mouth 3 (three) times daily as needed for muscle spasms.    . montelukast (SINGULAIR) 10 MG tablet Take 10 mg by mouth at bedtime.     . Multiple Vitamins-Minerals (MULTIVITAMIN ADULT) TABS Take 1 tablet by mouth in the morning.     . nortriptyline (PAMELOR) 25 MG capsule Take 75 mg by mouth at bedtime.    . pantoprazole (PROTONIX) 40 MG tablet Take 40 mg by mouth 2 (two) times daily.     Marland Kitchen rOPINIRole (REQUIP) 4 MG tablet Take 4 mg by mouth at bedtime.     . traZODone (DESYREL) 100 MG tablet Take 300 mg by mouth at bedtime.     . vitamin B-12 (CYANOCOBALAMIN) 1000 MCG tablet Take 1,000 mcg by mouth in the morning.        Discharge Medications: Please see discharge summary for a list of discharge medications.  Relevant Imaging Results:  Relevant Lab Results:   Additional Information SSN: [id number]  Ewing Schlein, LCSW

## 2020-06-15 NOTE — Progress Notes (Signed)
Inpatient Diabetes Program Recommendations  AACE/ADA: New Consensus Statement on Inpatient Glycemic Control (2015)  Target Ranges:  Prepandial:   less than 140 mg/dL      Peak postprandial:   less than 180 mg/dL (1-2 hours)      Critically ill patients:  140 - 180 mg/dL   Lab Results  Component Value Date   GLUCAP 367 (H) 06/15/2020    Review of Glycemic Control Results for Melissa Burke, Melissa Burke (MRN [id number]) as of 06/15/2020 12:16  Ref. Range 06/15/2020 12:02  Glucose-Capillary Latest Ref Range: 70 - 99 mg/dL 440 (H)   Diabetes history: No hx noted Current orders for Inpatient glycemic control: Novolog 0-9 units TID Solumedrol 60 mg Q6H  Inpatient Diabetes Program Recommendations:    In the setting of steroids, consider adding Levemir 12 units QD and Novolog 0-5 units QHS.   Thanks, Lujean Rave, MSN, RNC-OB Diabetes Coordinator [phone] (8a-5p)

## 2020-06-15 NOTE — TOC Progression Note (Signed)
Transition of Care [facility]) - Progression Note   Patient Details  Name: Melissa Burke MRN: [id number] Date of Birth: 02/07/1951  Transition of Care [facility]) CM/SW Contact  Ewing Schlein, LCSW Phone Number: 06/15/2020, 2:26 PM  Clinical Narrative:  FL2 completed. Patient faxed out for SNF via [facility] to [facility], [facility], and [facility]. Referral also faxed to [facility] and [facility]. CSW unable to reach admissions director for [facility], so CSW left voicemail requesting call back. TOC to follow.  Expected Discharge Plan: Skilled Nursing Facility Barriers to Discharge: Continued Medical Work up  Expected Discharge Plan and Services Expected Discharge Plan: Skilled Nursing Facility In-house Referral: Clinical Social Work Post Acute Care Choice: Skilled Nursing Facility Living arrangements for the past 2 months: Apartment  Readmission Risk Interventions No flowsheet data found.

## 2020-06-15 NOTE — ED Notes (Signed)
Pt in bed, pt cleaned self, supplies given, changed pure wick and undergarment, pt reports decreased pain, pt c/o lung pain with deep breathing.  Pt has dry cough

## 2020-06-16 LAB — COMPREHENSIVE METABOLIC PANEL
ALT: 52 U/L — ABNORMAL HIGH (ref 0–44)
AST: 46 U/L — ABNORMAL HIGH (ref 15–41)
Albumin: 3.2 g/dL — ABNORMAL LOW (ref 3.5–5.0)
Alkaline Phosphatase: 111 U/L (ref 38–126)
Anion gap: 11 (ref 5–15)
BUN: 23 mg/dL (ref 8–23)
CO2: 29 mmol/L (ref 22–32)
Calcium: 9.8 mg/dL (ref 8.9–10.3)
Chloride: 99 mmol/L (ref 98–111)
Creatinine, Ser: 0.74 mg/dL (ref 0.44–1.00)
GFR calc Af Amer: >60 mL/min (ref >60)
GFR calc non Af Amer: >60 mL/min (ref >60)
Glucose, Bld: 255 mg/dL — ABNORMAL HIGH (ref 70–99)
Potassium: 4.5 mmol/L (ref 3.5–5.1)
Sodium: 139 mmol/L (ref 135–145)
Total Bilirubin: 0.7 mg/dL (ref 0.3–1.2)
Total Protein: 7 g/dL (ref 6.5–8.1)

## 2020-06-16 LAB — HEMOGLOBIN A1C
Hgb A1c MFr Bld: 6.9 % — ABNORMAL HIGH (ref 4.8–5.6)
Mean Plasma Glucose: 151.33 mg/dL

## 2020-06-16 LAB — CBC
HCT: 38.2 % (ref 36.0–46.0)
Hemoglobin: 12.4 g/dL (ref 12.0–15.0)
MCH: 33 pg (ref 26.0–34.0)
MCHC: 32.5 g/dL (ref 30.0–36.0)
MCV: 101.6 fL — ABNORMAL HIGH (ref 80.0–100.0)
Platelets: 264 10*3/uL (ref 150–400)
RBC: 3.76 MIL/uL — ABNORMAL LOW (ref 3.87–5.11)
RDW: 12.6 % (ref 11.5–15.5)
WBC: 8.6 10*3/uL (ref 4.0–10.5)
nRBC: 0 % (ref 0.0–0.2)

## 2020-06-16 LAB — GLUCOSE, CAPILLARY
Glucose-Capillary: 226 mg/dL — ABNORMAL HIGH (ref 70–99)
Glucose-Capillary: 288 mg/dL — ABNORMAL HIGH (ref 70–99)
Glucose-Capillary: 237 mg/dL — ABNORMAL HIGH (ref 70–99)

## 2020-06-16 MED ORDER — IPRATROPIUM-ALBUTEROL 0.5-2.5 (3) MG/3ML IN SOLN
3.0000 mL | Freq: Four times a day (QID) | RESPIRATORY_TRACT | Status: DC
  Administered 2020-06-17 – 2020-06-19 (×8): 3 mL via RESPIRATORY_TRACT
  Filled 2020-06-16 (×7): qty 3

## 2020-06-16 MED ORDER — IPRATROPIUM-ALBUTEROL 0.5-2.5 (3) MG/3ML IN SOLN
3.0000 mL | RESPIRATORY_TRACT | Status: DC
  Administered 2020-06-16 (×3): 3 mL via RESPIRATORY_TRACT
  Filled 2020-06-16 (×3): qty 3

## 2020-06-16 NOTE — Progress Notes (Signed)
PROGRESS NOTE  Melissa Burke ZHY:[id number] DOB: 05/16/1951 DOA: 06/13/2020 PCP: Phill Myron, MD   LOS: 3 days   Brief Narrative / Interim history: 69 year old female with history of chronic hypoxic respiratory failure, OSA, home trilogy, recurrent admissions in the past secondary to pneumonia requiring intubations, chronic pain syndrome, hypertension, Raynaud's disease, chronic anemia, restless leg syndrome status post spinal cord stimulator came in with worsening shortness of breath, generalized weakness along with fever and chills.  She underwent a CT angiogram on admission which showed evidence of multifocal pneumonia.  COVID-19 testing was negative x2  Subjective / 24h Interval events: Tells me she walked to the bathroom and by the time she had to come back she was fairly short of breath and took a while for her to recover.  Also reports that her oxygen sats are dropping every time she moves around  Assessment & Plan: Principal Problem Acute on chronic hypoxic respiratory failure, multifocal pneumonia, possible COPD exacerbation, underlying OSA -patient has trilogy at home for OSA and follows up with pulmonary as an outpatient and will.  Covid testing was negative x2, CT angiogram on admission showed evidence of multifocal pneumonia.  She was started on ceftriaxone and azithromycin, continue, plan for total of 5 days.  She was also placed on nebulizers, steroids, continue. -Underwent a 2D echo which showed EF 60-65%, no WMA, grade 1 DD, normal RV -Patient is quite slow to improve, minimal improvement today  Active Problems Essential hypertension -continue amlodipine, losartan, monitor BP, slightly on the high side  Chronic pain syndrome-continue home regimen, pain appears controlled  Restless leg syndrome-continue ropinirole  Mild hyponatremia-resolved  General deconditioning-PT recommends SNF to which patient is agreeable.  SW consulted, anticipate stable to discharge perhaps  within 2-3 days  Scheduled Meds: . amLODipine  10 mg Oral Q breakfast  . calcium carbonate  500 mg of elemental calcium Oral BID WC  . celecoxib  200 mg Oral QHS  . cholecalciferol  5,000 Units Oral Daily  . DULoxetine  60 mg Oral BID  . enoxaparin (LOVENOX) injection  40 mg Subcutaneous Q24H  . insulin aspart  0-9 Units Subcutaneous TID WC  . insulin detemir  10 Units Subcutaneous Daily  . ipratropium-albuterol  3 mL Inhalation Q4H  . levothyroxine  112 mcg Oral Q0600  . loratadine  10 mg Oral Daily  . losartan  25 mg Oral Daily  . methylPREDNISolone (SOLU-MEDROL) injection  60 mg Intravenous Q6H  . montelukast  10 mg Oral QHS  . pantoprazole  40 mg Oral BID  . rOPINIRole  4 mg Oral QHS  . traZODone  300 mg Oral QHS   Continuous Infusions: . azithromycin 500 mg (06/15/20 1630)  . cefTRIAXone (ROCEPHIN)  IV 1 g (06/15/20 1807)   PRN Meds:.acetaminophen **OR** acetaminophen, guaiFENesin, HYDROcodone-acetaminophen, ipratropium-albuterol, menthol-cetylpyridinium, methocarbamol, senna-docusate  Diet Orders (From admission, onward)    Start     Ordered   06/13/20 2127  Diet 2 gram sodium Room service appropriate? Yes; Fluid consistency: Thin  Diet effective now       Question Answer Comment  Room service appropriate? Yes   Fluid consistency: Thin      06/13/20 2130          DVT prophylaxis: enoxaparin (LOVENOX) injection 40 mg Start: 06/13/20 2200 SCDs Start: 06/13/20 2126     Code Status: Full Code  Family Communication: no family at bedside   Status is: Inpatient  Remains inpatient appropriate because:Inpatient level of care appropriate due to severity  of illness   Dispo: The patient is from: Home              Anticipated d/c is to: SNF              Anticipated d/c date is: 2 days              Patient currently is not medically stable to d/c.  Consultants:  None   Procedures:  2D echo  Microbiology  None   Antimicrobials: Ceftriaxone 8/30  >> Azithromycin 8/30 >>    Objective: Vitals:   06/16/20 0257 06/16/20 0500 06/16/20 0510 06/16/20 0837  BP:   (!) 168/98   Pulse:   95   Resp:   20   Temp:   97.8 F (36.6 C)   TempSrc:      SpO2: 94%  91% 93%  Weight:  88.5 kg    Height:        Intake/Output Summary (Last 24 hours) at 06/16/2020 1125 Last data filed at 06/16/2020 0400 Gross per 24 hour  Intake 450 ml  Output --  Net 450 ml   Filed Weights   06/16/20 0500  Weight: 88.5 kg    Examination:  Constitutional: Slightly tachypneic-just got back from the bathroom walk Eyes: No scleral icterus seen ENMT: Moist mucous membranes Neck: normal, supple Respiratory: End expiratory wheezing present, no crackles, tachypneic Cardiovascular: Regular rate and rhythm, no murmurs appreciated.  No peripheral edema Abdomen: Soft, nontender, nondistended, bowel sounds positive Musculoskeletal: no clubbing / cyanosis.  Skin: No rashes appreciated Neurologic: Nonfocal, equal strength  Data Reviewed: I have independently reviewed following labs and imaging studies   CBC: Recent Labs  Lab 06/13/20 1431 06/14/20 0314 06/15/20 0316 06/16/20 0601  WBC 7.1 5.4 5.3 8.6  NEUTROABS 4.9  --  4.9  --   HGB 12.6 12.2 12.2 12.4  HCT 38.6 37.7 37.2 38.2  MCV 101.0* 102.2* 101.1* 101.6*  PLT 202 200 206 264   Basic Metabolic Panel: Recent Labs  Lab 06/13/20 1159 06/14/20 0314 06/15/20 0316 06/16/20 0601  NA 134* 139 136 139  K 3.6 3.7 3.9 4.5  CL 94* 99 97* 99  CO2 28 26 24 29   GLUCOSE 117* 206* 342* 255*  BUN 12 10 12 23   CREATININE 0.73 0.61 0.57 0.74  CALCIUM 9.7 9.3 9.2 9.8  MG  --   --  2.0  --    Liver Function Tests: Recent Labs  Lab 06/14/20 0314 06/15/20 0316 06/16/20 0601  AST 23 21 46*  ALT 27 29 52*  ALKPHOS 94 101 111  BILITOT 0.7 0.7 0.7  PROT 6.9 6.8 7.0  ALBUMIN 3.3* 3.3* 3.2*   Coagulation Profile: No results for input(s): INR, PROTIME in the last 168 hours. HbA1C: No results for  input(s): HGBA1C in the last 72 hours. CBG: Recent Labs  Lab 06/15/20 1202 06/15/20 1600 06/15/20 2153 06/16/20 0823  GLUCAP 367* 343* 176* 226*    Recent Results (from the past 240 hour(s))  SARS Coronavirus 2 by RT PCR (hospital order, performed in [facility] hospital lab) Nasopharyngeal Nasopharyngeal Swab     Status: None   Collection Time: 06/13/20 12:21 PM   Specimen: Nasopharyngeal Swab  Result Value Ref Range Status   SARS Coronavirus 2 NEGATIVE NEGATIVE Final    Comment: (NOTE) SARS-CoV-2 target nucleic acids are NOT DETECTED.  The SARS-CoV-2 RNA is generally detectable in upper and lower respiratory specimens during the acute phase of infection. The lowest concentration  of SARS-CoV-2 viral copies this assay can detect is 250 copies / mL. A negative result does not preclude SARS-CoV-2 infection and should not be used as the sole basis for treatment or other patient management decisions.  A negative result may occur with improper specimen collection / handling, submission of specimen other than nasopharyngeal swab, presence of viral mutation(s) within the areas targeted by this assay, and inadequate number of viral copies (<250 copies / mL). A negative result must be combined with clinical observations, patient history, and epidemiological information.  Fact Sheet for Patients:   BoilerBrush.com.cy  Fact Sheet for Healthcare Providers: https://pope.com/  This test is not yet approved or  cleared by the [place] FDA and has been authorized for detection and/or diagnosis of SARS-CoV-2 by FDA under an Emergency Use Authorization (EUA).  This EUA will remain in effect (meaning this test can be used) for the duration of the COVID-19 declaration under Section 564(b)(1) of the Act, 21 U.S.C. section 360bbb-3(b)(1), unless the authorization is terminated or revoked sooner.  Performed at [facility], [address], [place], [place] [address]   SARS Coronavirus 2 by RT PCR (hospital order, performed in [facility] hospital lab) Nasopharyngeal Nasopharyngeal Swab     Status: None   Collection Time: 06/13/20  7:02 PM   Specimen: Nasopharyngeal Swab  Result Value Ref Range Status   SARS Coronavirus 2 NEGATIVE NEGATIVE Final    Comment: (NOTE) SARS-CoV-2 target nucleic acids are NOT DETECTED.  The SARS-CoV-2 RNA is generally detectable in upper and lower respiratory specimens during the acute phase of infection. The lowest concentration of SARS-CoV-2 viral copies this assay can detect is 250 copies / mL. A negative result does not preclude SARS-CoV-2 infection and should not be used as the sole basis for treatment or other patient management decisions.  A negative result may occur with improper specimen collection / handling, submission of specimen other than nasopharyngeal swab, presence of viral mutation(s) within the areas targeted by this assay, and inadequate number of viral copies (<250 copies / mL). A negative result must be combined with clinical observations, patient history, and epidemiological information.  Fact Sheet for Patients:   BoilerBrush.com.cy  Fact Sheet for Healthcare Providers: https://pope.com/  This test is not yet approved or  cleared by the [place] FDA and has been authorized for detection and/or diagnosis of SARS-CoV-2 by FDA under an Emergency Use Authorization (EUA).  This EUA will remain in effect (meaning this test can be used) for the duration of the COVID-19 declaration under Section 564(b)(1) of the Act, 21 U.S.C. section 360bbb-3(b)(1), unless the authorization is terminated or revoked sooner.  Performed at [facility], [address], [place], [place] [address]      Radiology Studies: No results found.  Pamella Pert, MD, PhD [facility]  Between 7 am - 7 pm I am available, please contact me  via Amion or Securechat  Between 7 pm - 7 am I am not available, please contact night coverage MD/APP via Amion

## 2020-06-16 NOTE — TOC Progression Note (Signed)
Transition of Care [facility]) - Progression Note   Patient Details  Name: Melissa Burke MRN: [id number] Date of Birth: 16-Sep-1951  Transition of Care [facility]) CM/SW Contact  Ewing Schlein, LCSW Phone Number: 06/16/2020, 1:39 PM  Clinical Narrative: Patient received bed offers for [facility], and [facility]. CSW discussed bed offers with patient and patient selected [facility]. CSW spoke with Darl Pikes in admissions and per Darl Pikes, patient can come if she can use a CPAP. Per hospitalist, patient can discharge to SNF and use the CPAP. Patient is agreeable to CPAP use at SNF. Darl Pikes provided fax information for a weekday [phone]) or a weekend [phone]) and requested clinicals be faxed early if a weekend discharge. Requested documentation includes: demographics, facesheet, copy of negative COVID test, H&P, PT notes, discharge orders, and discharge summary. TOC to follow.  Expected Discharge Plan: Skilled Nursing Facility Barriers to Discharge: Continued Medical Work up  Expected Discharge Plan and Services Expected Discharge Plan: Skilled Nursing Facility In-house Referral: Clinical Social Work Post Acute Care Choice: Skilled Nursing Facility Living arrangements for the past 2 months: Apartment  Readmission Risk Interventions No flowsheet data found.

## 2020-06-17 LAB — CBC
HCT: 36.1 % (ref 36.0–46.0)
Hemoglobin: 12.3 g/dL (ref 12.0–15.0)
MCH: 33.2 pg (ref 26.0–34.0)
MCHC: 34.1 g/dL (ref 30.0–36.0)
MCV: 97.3 fL (ref 80.0–100.0)
Platelets: 231 10*3/uL (ref 150–400)
RBC: 3.71 MIL/uL — ABNORMAL LOW (ref 3.87–5.11)
RDW: 12.2 % (ref 11.5–15.5)
WBC: 7 10*3/uL (ref 4.0–10.5)
nRBC: 0 % (ref 0.0–0.2)

## 2020-06-17 LAB — GLUCOSE, CAPILLARY
Glucose-Capillary: 242 mg/dL — ABNORMAL HIGH (ref 70–99)
Glucose-Capillary: 310 mg/dL — ABNORMAL HIGH (ref 70–99)
Glucose-Capillary: 268 mg/dL — ABNORMAL HIGH (ref 70–99)
Glucose-Capillary: 265 mg/dL — ABNORMAL HIGH (ref 70–99)

## 2020-06-17 LAB — BASIC METABOLIC PANEL
Anion gap: 14 (ref 5–15)
BUN: 18 mg/dL (ref 8–23)
CO2: 22 mmol/L (ref 22–32)
Calcium: 9.1 mg/dL (ref 8.9–10.3)
Chloride: 97 mmol/L — ABNORMAL LOW (ref 98–111)
Creatinine, Ser: 0.61 mg/dL (ref 0.44–1.00)
GFR calc Af Amer: >60 mL/min (ref >60)
GFR calc non Af Amer: >60 mL/min (ref >60)
Glucose, Bld: 264 mg/dL — ABNORMAL HIGH (ref 70–99)
Potassium: 4 mmol/L (ref 3.5–5.1)
Sodium: 133 mmol/L — ABNORMAL LOW (ref 135–145)

## 2020-06-17 MED ORDER — BUDESONIDE 0.25 MG/2ML IN SUSP
0.2500 mg | Freq: Two times a day (BID) | RESPIRATORY_TRACT | Status: DC
  Administered 2020-06-17 – 2020-06-19 (×5): 0.25 mg via RESPIRATORY_TRACT
  Filled 2020-06-17 (×5): qty 2

## 2020-06-17 NOTE — Progress Notes (Signed)
Inpatient Diabetes Program Recommendations  AACE/ADA: New Consensus Statement on Inpatient Glycemic Control (2015)  Target Ranges:  Prepandial:   less than 140 mg/dL      Peak postprandial:   less than 180 mg/dL (1-2 hours)      Critically ill patients:  140 - 180 mg/dL   Lab Results  Component Value Date   GLUCAP 310 (H) 06/17/2020   HGBA1C 6.9 (H) 06/16/2020    Review of Glycemic Control Results for FAWNDA, VITULLO (MRN [id number]) as of 06/17/2020 14:11  Ref. Range 06/16/2020 08:23 06/16/2020 11:29 06/16/2020 17:51 06/17/2020 07:52 06/17/2020 11:50  Glucose-Capillary Latest Ref Range: 70 - 99 mg/dL 662 (H) 947 (H) 654 (H) 242 (H) 310 (H)   Diabetes history:  No HX  Current orders for Inpatient glycemic control:  Levemir 10 daily Novolog 0-9 units tid Solumedrol 60 mg q6H   Inpatient Diabetes Program Recommendations:    Levemir 7 units bid Novolog 0-15 units tid Novolog 0-5 units qhs  Noted no CBG at bedtime last night  Will continue to follow while inpatient.  Thank you, Dulce Sellar, RN, BSN Diabetes Coordinator Inpatient Diabetes Program [phone] (team pager from 8a-5p)

## 2020-06-17 NOTE — Progress Notes (Signed)
Physical Therapy Treatment Patient Details Name: Melissa Burke MRN: [id number] DOB: 04-05-1951 Today's Date: 06/17/2020    History of Present Illness Patient is a 69 year old Caucasian female who is up-to-date with her Covid vaccination.  Her medical history is significant for chronic respiratory failure secondary to obstructive sleep apnea on trilogy at home.  She has had recurrent admissions in the past secondary to pneumonia, status post intubations in 2010.  Comorbidities include chronic pain syndrome, hypertension, Raynaud's disease, chronic anemia, restless leg syndrome and status post spinal cord stimulation system     PT Comments    Pt improving bed mobility with no assistance required.  Increased activity tolerance with ability to ambulate 100 ft prior request for seated rest break.  Continues to demonstrate unsteadiness with certain directions of gait with min guard required for safety.  C/O SOB during gait.  O2 saturation range from 87-97% with 4L O2 A.  Rest break complete mid-way for O2 saturation increase and fatigue due to weakness.  EOS pt left sitting on EOB, call bell within reach.  Encouraged to sit up as much as able to improve saturation and strengthening.  No reports of pain through session.    Follow Up Recommendations  SNF;Supervision - Intermittent;Supervision for mobility/OOB     Equipment Recommendations  None recommended by PT    Recommendations for Other Services       Precautions / Restrictions Precautions Precautions: Fall    Mobility  Bed Mobility Overal bed mobility: Independent                Transfers Overall transfer level: Independent   Transfers: Sit to/from Stand Sit to Stand: Independent            Ambulation/Gait Ambulation/Gait assistance: Min guard Gait Distance (Feet): 120 Feet (Amb 100 ft then seated rest break to increase O2 sat from 87 to 94 prior return to room, able to ambulate past room with O2 sat at  94%) Assistive device: None Gait Pattern/deviations: Decreased step length - right;Decreased step length - left;Decreased stride length Gait velocity: decreased   General Gait Details: unsteady initially upon standing with min cueing, occassional LOB with turns, increased cadence, decrease arm swing. c/o SOB required seated rest break to increase O2 sat.   Stairs             Wheelchair Mobility    Modified Rankin (Stroke Patients Only)       Balance                                            Cognition Arousal/Alertness: Awake/alert Behavior During Therapy: WFL for tasks assessed/performed Overall Cognitive Status: Within Functional Limits for tasks assessed                                        Exercises      General Comments        Pertinent Vitals/Pain Pain Assessment: 0-10    Home Living                      Prior Function            PT Goals (current goals can now be found in the care plan section)      Frequency  Min 3X/week      PT Plan      Co-evaluation              AM-PAC PT "6 Clicks" Mobility   Outcome Measure  Help needed turning from your back to your side while in a flat bed without using bedrails?: None Help needed moving from lying on your back to sitting on the side of a flat bed without using bedrails?: None Help needed moving to and from a bed to a chair (including a wheelchair)?: None Help needed standing up from a chair using your arms (e.g., wheelchair or bedside chair)?: A Little Help needed to walk in hospital room?: None Help needed climbing 3-5 steps with a railing? : A Lot 6 Click Score: 21    End of Session Equipment Utilized During Treatment: Oxygen Activity Tolerance: Patient tolerated treatment well;Patient limited by fatigue Patient left: in bed;with call bell/phone within reach (seated on EOB at EOS) Nurse Communication: Mobility status PT Visit  Diagnosis: Unsteadiness on feet (R26.81);Other abnormalities of gait and mobility (R26.89);Muscle weakness (generalized) (M62.81)     Time: 1335-1400 PT Time Calculation (min) (ACUTE ONLY): 25 min  Charges:  $Therapeutic Activity: 23-37 mins                     Becky Sax, LPTA/CLT; CBIS [phone]   Juel Burrow 06/17/2020, 2:11 PM

## 2020-06-17 NOTE — Plan of Care (Signed)

## 2020-06-17 NOTE — TOC Progression Note (Signed)
Transition of Care [facility]) - Progression Note   Patient Details  Name: Melissa Burke MRN: [id number] Date of Birth: Jan 17, 1951  Transition of [facility]) CM/SW Contact  Ewing Schlein, LCSW Phone Number: 06/17/2020, 1:55 PM  Clinical Narrative: Patient will likely be a weekend discharge. CSW spoke with Darl Pikes in admissions with [facility] will need Cpap documentation. CSW faxed over recent progress note with Cpap information. CSW updated patient about likely weekend discharge. TOC to follow.  Expected Discharge Plan: Skilled Nursing Facility Barriers to Discharge: Continued Medical Work up  Expected Discharge Plan and Services Expected Discharge Plan: Skilled Nursing Facility In-house Referral: Clinical Social Work Post Acute Care Choice: Skilled Nursing Facility Living arrangements for the past 2 months: Apartment  Readmission Risk Interventions No flowsheet data found.

## 2020-06-17 NOTE — Progress Notes (Addendum)
PROGRESS NOTE  Melissa Burke PPI:[id number] DOB: 12-04-1950 DOA: 06/13/2020 PCP: Phill Myron, MD   LOS: 4 days   Brief Narrative / Interim history: 69 year old female with history of chronic hypoxic respiratory failure, OSA, home trilogy, recurrent admissions in the past secondary to pneumonia requiring intubations, chronic pain syndrome, hypertension, Raynaud's disease, chronic anemia, restless leg syndrome status post spinal cord stimulator came in with worsening shortness of breath, generalized weakness along with fever and chills.  She underwent a CT angiogram on admission which showed evidence of multifocal pneumonia.  COVID-19 testing was negative x2  Subjective / 24h Interval events: Appreciates improvement but remains significantly short of breath.  Walked to the bathroom on 5 L nasal cannula this morning and sats were in the 70s.  Took her a while to recover.  Assessment & Plan: Principal Problem Acute on chronic hypoxic respiratory failure, multifocal pneumonia, possible COPD exacerbation, underlying OSA -patient has trilogy at home for OSA and follows up with pulmonary as an outpatient and will.  Covid testing was negative x2, CT angiogram on admission showed evidence of multifocal pneumonia.  She was started on ceftriaxone and azithromycin, continue, plan for total of 5 days.  She was also placed on nebulizers, steroids, continue. -Underwent a 2D echo which showed EF 60-65%, no WMA, grade 1 DD, normal RV -Remains slow to improve, continues to require 5 L nasal cannula still desat into the 70s with ambulation, not ready for discharge. -at discharge she can use a regular Cpap with 10 cm pressure and 4L o2 bleed in  Active Problems Essential hypertension -continue amlodipine, losartan, continue to monitor blood pressure  Chronic pain syndrome-continue home regimen, pain appears controlled  Restless leg syndrome-continue ropinirole  Mild hyponatremia-resolved  General  deconditioning-PT recommends SNF to which patient is agreeable.  Placement when more stable   Scheduled Meds: . amLODipine  10 mg Oral Q breakfast  . budesonide (PULMICORT) nebulizer solution  0.25 mg Nebulization BID  . calcium carbonate  500 mg of elemental calcium Oral BID WC  . celecoxib  200 mg Oral QHS  . cholecalciferol  5,000 Units Oral Daily  . DULoxetine  60 mg Oral BID  . enoxaparin (LOVENOX) injection  40 mg Subcutaneous Q24H  . insulin aspart  0-9 Units Subcutaneous TID WC  . insulin detemir  10 Units Subcutaneous Daily  . ipratropium-albuterol  3 mL Inhalation QID  . levothyroxine  112 mcg Oral Q0600  . loratadine  10 mg Oral Daily  . losartan  25 mg Oral Daily  . methylPREDNISolone (SOLU-MEDROL) injection  60 mg Intravenous Q6H  . montelukast  10 mg Oral QHS  . pantoprazole  40 mg Oral BID  . rOPINIRole  4 mg Oral QHS  . traZODone  300 mg Oral QHS   Continuous Infusions: . azithromycin 500 mg (06/16/20 1544)  . cefTRIAXone (ROCEPHIN)  IV 1 g (06/16/20 1832)   PRN Meds:.acetaminophen **OR** acetaminophen, guaiFENesin, HYDROcodone-acetaminophen, ipratropium-albuterol, menthol-cetylpyridinium, methocarbamol, senna-docusate  Diet Orders (From admission, onward)    Start     Ordered   06/13/20 2127  Diet 2 gram sodium Room service appropriate? Yes; Fluid consistency: Thin  Diet effective now       Question Answer Comment  Room service appropriate? Yes   Fluid consistency: Thin      06/13/20 2130          DVT prophylaxis: enoxaparin (LOVENOX) injection 40 mg Start: 06/13/20 2200 SCDs Start: 06/13/20 2126     Code Status: Full Code  Family Communication: no family at bedside   Status is: Inpatient  Remains inpatient appropriate because:Inpatient level of care appropriate due to severity of illness   Dispo: The patient is from: Home              Anticipated d/c is to: SNF              Anticipated d/c date is: 2 days              Patient currently is  not medically stable to d/c.  Consultants:  None   Procedures:  2D echo  Microbiology  None   Antimicrobials: Ceftriaxone 8/30 >> Azithromycin 8/30 >>    Objective: Vitals:   06/17/20 0405 06/17/20 0500 06/17/20 0805 06/17/20 0830  BP:      Pulse:      Resp:      Temp:      TempSrc:      SpO2: 95%  95% 93%  Weight:  88.7 kg    Height:       No intake or output data in the 24 hours ending 06/17/20 0958 Filed Weights   06/16/20 0500 06/17/20 0500  Weight: 88.5 kg 88.7 kg    Examination:  Constitutional: No distress, in bed Eyes: No icterus ENMT: Moist mucous membranes Neck: normal, supple Respiratory: Faint end expiratory wheezing, improved, no crackles Cardiovascular: Regular rate and rhythm, no murmurs, no edema Abdomen: Soft, nontender, nondistended, positive bowel sounds Musculoskeletal: no clubbing / cyanosis.  Skin: No rashes seen Neurologic: No focal deficits, equal strength  Data Reviewed: I have independently reviewed following labs and imaging studies   CBC: Recent Labs  Lab 06/13/20 1431 06/14/20 0314 06/15/20 0316 06/16/20 0601  WBC 7.1 5.4 5.3 8.6  NEUTROABS 4.9  --  4.9  --   HGB 12.6 12.2 12.2 12.4  HCT 38.6 37.7 37.2 38.2  MCV 101.0* 102.2* 101.1* 101.6*  PLT 202 200 206 264   Basic Metabolic Panel: Recent Labs  Lab 06/13/20 1159 06/14/20 0314 06/15/20 0316 06/16/20 0601  NA 134* 139 136 139  K 3.6 3.7 3.9 4.5  CL 94* 99 97* 99  CO2 28 26 24 29   GLUCOSE 117* 206* 342* 255*  BUN 12 10 12 23   CREATININE 0.73 0.61 0.57 0.74  CALCIUM 9.7 9.3 9.2 9.8  MG  --   --  2.0  --    Liver Function Tests: Recent Labs  Lab 06/14/20 0314 06/15/20 0316 06/16/20 0601  AST 23 21 46*  ALT 27 29 52*  ALKPHOS 94 101 111  BILITOT 0.7 0.7 0.7  PROT 6.9 6.8 7.0  ALBUMIN 3.3* 3.3* 3.2*   Coagulation Profile: No results for input(s): INR, PROTIME in the last 168 hours. HbA1C: Recent Labs    06/16/20 0601  HGBA1C 6.9*    CBG: Recent Labs  Lab 06/15/20 2153 06/16/20 0823 06/16/20 1129 06/16/20 1751 06/17/20 0752  GLUCAP 176* 226* 288* 237* 242*    Recent Results (from the past 240 hour(s))  SARS Coronavirus 2 by RT PCR (hospital order, performed in [facility] hospital lab) Nasopharyngeal Nasopharyngeal Swab     Status: None   Collection Time: 06/13/20 12:21 PM   Specimen: Nasopharyngeal Swab  Result Value Ref Range Status   SARS Coronavirus 2 NEGATIVE NEGATIVE Final    Comment: (NOTE) SARS-CoV-2 target nucleic acids are NOT DETECTED.  The SARS-CoV-2 RNA is generally detectable in upper and lower respiratory specimens during the acute phase of infection. The lowest  concentration of SARS-CoV-2 viral copies this assay can detect is 250 copies / mL. A negative result does not preclude SARS-CoV-2 infection and should not be used as the sole basis for treatment or other patient management decisions.  A negative result may occur with improper specimen collection / handling, submission of specimen other than nasopharyngeal swab, presence of viral mutation(s) within the areas targeted by this assay, and inadequate number of viral copies (<250 copies / mL). A negative result must be combined with clinical observations, patient history, and epidemiological information.  Fact Sheet for Patients:   BoilerBrush.com.cy  Fact Sheet for Healthcare Providers: https://pope.com/  This test is not yet approved or  cleared by the [place] FDA and has been authorized for detection and/or diagnosis of SARS-CoV-2 by FDA under an Emergency Use Authorization (EUA).  This EUA will remain in effect (meaning this test can be used) for the duration of the COVID-19 declaration under Section 564(b)(1) of the Act, 21 U.S.C. section 360bbb-3(b)(1), unless the authorization is terminated or revoked sooner.  Performed at [facility], [address], [place],  [place] [address]   SARS Coronavirus 2 by RT PCR (hospital order, performed in [facility] hospital lab) Nasopharyngeal Nasopharyngeal Swab     Status: None   Collection Time: 06/13/20  7:02 PM   Specimen: Nasopharyngeal Swab  Result Value Ref Range Status   SARS Coronavirus 2 NEGATIVE NEGATIVE Final    Comment: (NOTE) SARS-CoV-2 target nucleic acids are NOT DETECTED.  The SARS-CoV-2 RNA is generally detectable in upper and lower respiratory specimens during the acute phase of infection. The lowest concentration of SARS-CoV-2 viral copies this assay can detect is 250 copies / mL. A negative result does not preclude SARS-CoV-2 infection and should not be used as the sole basis for treatment or other patient management decisions.  A negative result may occur with improper specimen collection / handling, submission of specimen other than nasopharyngeal swab, presence of viral mutation(s) within the areas targeted by this assay, and inadequate number of viral copies (<250 copies / mL). A negative result must be combined with clinical observations, patient history, and epidemiological information.  Fact Sheet for Patients:   BoilerBrush.com.cy  Fact Sheet for Healthcare Providers: https://pope.com/  This test is not yet approved or  cleared by the [place] FDA and has been authorized for detection and/or diagnosis of SARS-CoV-2 by FDA under an Emergency Use Authorization (EUA).  This EUA will remain in effect (meaning this test can be used) for the duration of the COVID-19 declaration under Section 564(b)(1) of the Act, 21 U.S.C. section 360bbb-3(b)(1), unless the authorization is terminated or revoked sooner.  Performed at [facility], [address] [place], [place] [address]      Radiology Studies: No results found.  Pamella Pert, MD, PhD [facility]  Between 7 am - 7 pm I am available, please contact me via Amion or  Securechat  Between 7 pm - 7 am I am not available, please contact night coverage MD/APP via Amion

## 2020-06-18 LAB — GLUCOSE, CAPILLARY
Glucose-Capillary: 292 mg/dL — ABNORMAL HIGH (ref 70–99)
Glucose-Capillary: 307 mg/dL — ABNORMAL HIGH (ref 70–99)
Glucose-Capillary: 336 mg/dL — ABNORMAL HIGH (ref 70–99)
Glucose-Capillary: 300 mg/dL — ABNORMAL HIGH (ref 70–99)

## 2020-06-18 MED ORDER — FLUCONAZOLE 150 MG PO TABS
150.0000 mg | ORAL_TABLET | Freq: Once | ORAL | Status: AC
  Administered 2020-06-18: 150 mg via ORAL
  Filled 2020-06-18: qty 1

## 2020-06-18 MED ORDER — METHYLPREDNISOLONE SODIUM SUCC 125 MG IJ SOLR
60.0000 mg | Freq: Two times a day (BID) | INTRAMUSCULAR | Status: DC
  Administered 2020-06-18 – 2020-06-19 (×2): 60 mg via INTRAVENOUS
  Filled 2020-06-18 (×2): qty 2

## 2020-06-18 NOTE — Progress Notes (Signed)
CBG 300. No bedtime coverage ordered at this time. Midlevel notified.

## 2020-06-18 NOTE — Progress Notes (Signed)
PROGRESS NOTE  Melissa Burke VQM:[id number] DOB: 07/15/1951 DOA: 06/13/2020 PCP: Phill Myron, MD   LOS: 5 days   Brief Narrative / Interim history: 69 year old female with history of chronic hypoxic respiratory failure, OSA, home trilogy, recurrent admissions in the past secondary to pneumonia requiring intubations, chronic pain syndrome, hypertension, Raynaud's disease, chronic anemia, restless leg syndrome status post spinal cord stimulator came in with worsening shortness of breath, generalized weakness along with fever and chills.  She underwent a CT angiogram on admission which showed evidence of multifocal pneumonia.  COVID-19 testing was negative x2  Subjective / 24h Interval events: Overall improved, however with minimal activity on 4 L of oxygen drops into the 70s.  Still cannot take a deep breath  Assessment & Plan: Principal Problem Acute on chronic hypoxic respiratory failure, multifocal pneumonia, possible COPD exacerbation, underlying OSA -patient has trilogy at home for OSA and follows up with pulmonary as an outpatient and will.  Covid testing was negative x2, CT angiogram on admission showed evidence of multifocal pneumonia.  She was started on ceftriaxone and azithromycin, continue for 1 more day. -Continue nebulizers, steroids -Underwent a 2D echo which showed EF 60-65%, no WMA, grade 1 DD, normal RV -Remains slow to improve, not yet ready to leave the hospital -at discharge she can use a regular Cpap with 10 cm pressure and 4L o2 bleed in  Active Problems Essential hypertension -continue amlodipine, losartan, continue to monitor blood pressure  Chronic pain syndrome-continue home regimen, pain appears controlled  Restless leg syndrome-continue ropinirole  Mild hyponatremia-resolved  General deconditioning-PT recommends SNF to which patient is agreeable.  Placement when more stable, in 1 to 2 days   Scheduled Meds: . amLODipine  10 mg Oral Q breakfast  .  budesonide (PULMICORT) nebulizer solution  0.25 mg Nebulization BID  . calcium carbonate  500 mg of elemental calcium Oral BID WC  . celecoxib  200 mg Oral QHS  . cholecalciferol  5,000 Units Oral Daily  . DULoxetine  60 mg Oral BID  . enoxaparin (LOVENOX) injection  40 mg Subcutaneous Q24H  . insulin aspart  0-9 Units Subcutaneous TID WC  . insulin detemir  10 Units Subcutaneous Daily  . ipratropium-albuterol  3 mL Inhalation QID  . levothyroxine  112 mcg Oral Q0600  . loratadine  10 mg Oral Daily  . losartan  25 mg Oral Daily  . methylPREDNISolone (SOLU-MEDROL) injection  60 mg Intravenous Q6H  . montelukast  10 mg Oral QHS  . pantoprazole  40 mg Oral BID  . rOPINIRole  4 mg Oral QHS  . traZODone  300 mg Oral QHS   Continuous Infusions: . azithromycin 500 mg (06/17/20 1429)  . cefTRIAXone (ROCEPHIN)  IV 1 g (06/17/20 1717)   PRN Meds:.acetaminophen **OR** acetaminophen, guaiFENesin, HYDROcodone-acetaminophen, ipratropium-albuterol, menthol-cetylpyridinium, methocarbamol, senna-docusate  Diet Orders (From admission, onward)    Start     Ordered   06/13/20 2127  Diet 2 gram sodium Room service appropriate? Yes; Fluid consistency: Thin  Diet effective now       Question Answer Comment  Room service appropriate? Yes   Fluid consistency: Thin      06/13/20 2130          DVT prophylaxis: enoxaparin (LOVENOX) injection 40 mg Start: 06/13/20 2200 SCDs Start: 06/13/20 2126     Code Status: Full Code  Family Communication: no family at bedside   Status is: Inpatient  Remains inpatient appropriate because:Inpatient level of care appropriate due to severity of illness  Dispo: The patient is from: Home              Anticipated d/c is to: SNF              Anticipated d/c date is: 2 days              Patient currently is not medically stable to d/c.  Consultants:  None   Procedures:  2D echo  Microbiology  None   Antimicrobials: Ceftriaxone 8/30 >> Azithromycin  8/30 >>    Objective: Vitals:   06/17/20 2317 06/18/20 0300 06/18/20 0516 06/18/20 0837  BP:   (!) 168/88   Pulse: 86  97   Resp: 18  16   Temp:   98 F (36.7 C)   TempSrc:   Oral   SpO2: 96%  94% 95%  Weight:  89.8 kg    Height:        Intake/Output Summary (Last 24 hours) at 06/18/2020 1331 Last data filed at 06/18/2020 0400 Gross per 24 hour  Intake 340 ml  Output 450 ml  Net -110 ml   Filed Weights   06/16/20 0500 06/17/20 0500 06/18/20 0300  Weight: 88.5 kg 88.7 kg 89.8 kg    Examination:  Constitutional: NAD, in bed, tachypneic Eyes: No scleral icterus ENMT: Moist mucous membranes Neck: normal, supple Respiratory: Overall clear, faint wheezing at the bases, no crackles Cardiovascular: Regular rate and rhythm, no murmurs Abdomen: Soft, nontender, nondistended, bowel sounds positive Musculoskeletal: no clubbing / cyanosis.  Skin: No rashes seen Neurologic: Nonfocal, equal strength  Data Reviewed: I have independently reviewed following labs and imaging studies   CBC: Recent Labs  Lab 06/13/20 1431 06/14/20 0314 06/15/20 0316 06/16/20 0601 06/17/20 0819  WBC 7.1 5.4 5.3 8.6 7.0  NEUTROABS 4.9  --  4.9  --   --   HGB 12.6 12.2 12.2 12.4 12.3  HCT 38.6 37.7 37.2 38.2 36.1  MCV 101.0* 102.2* 101.1* 101.6* 97.3  PLT 202 200 206 264 231   Basic Metabolic Panel: Recent Labs  Lab 06/13/20 1159 06/14/20 0314 06/15/20 0316 06/16/20 0601 06/17/20 0819  NA 134* 139 136 139 133*  K 3.6 3.7 3.9 4.5 4.0  CL 94* 99 97* 99 97*  CO2 28 26 24 29 22   GLUCOSE 117* 206* 342* 255* 264*  BUN 12 10 12 23 18   CREATININE 0.73 0.61 0.57 0.74 0.61  CALCIUM 9.7 9.3 9.2 9.8 9.1  MG  --   --  2.0  --   --    Liver Function Tests: Recent Labs  Lab 06/14/20 0314 06/15/20 0316 06/16/20 0601  AST 23 21 46*  ALT 27 29 52*  ALKPHOS 94 101 111  BILITOT 0.7 0.7 0.7  PROT 6.9 6.8 7.0  ALBUMIN 3.3* 3.3* 3.2*   Coagulation Profile: No results for input(s): INR, PROTIME  in the last 168 hours. HbA1C: Recent Labs    06/16/20 0601  HGBA1C 6.9*   CBG: Recent Labs  Lab 06/17/20 1150 06/17/20 1658 06/17/20 2051 06/18/20 0716 06/18/20 1108  GLUCAP 310* 268* 265* 292* 307*    Recent Results (from the past 240 hour(s))  SARS Coronavirus 2 by RT PCR (hospital order, performed in [facility] hospital lab) Nasopharyngeal Nasopharyngeal Swab     Status: None   Collection Time: 06/13/20 12:21 PM   Specimen: Nasopharyngeal Swab  Result Value Ref Range Status   SARS Coronavirus 2 NEGATIVE NEGATIVE Final    Comment: (NOTE) SARS-CoV-2 target nucleic acids  are NOT DETECTED.  The SARS-CoV-2 RNA is generally detectable in upper and lower respiratory specimens during the acute phase of infection. The lowest concentration of SARS-CoV-2 viral copies this assay can detect is 250 copies / mL. A negative result does not preclude SARS-CoV-2 infection and should not be used as the sole basis for treatment or other patient management decisions.  A negative result may occur with improper specimen collection / handling, submission of specimen other than nasopharyngeal swab, presence of viral mutation(s) within the areas targeted by this assay, and inadequate number of viral copies (<250 copies / mL). A negative result must be combined with clinical observations, patient history, and epidemiological information.  Fact Sheet for Patients:   BoilerBrush.com.cy  Fact Sheet for Healthcare Providers: https://pope.com/  This test is not yet approved or  cleared by the [place] FDA and has been authorized for detection and/or diagnosis of SARS-CoV-2 by FDA under an Emergency Use Authorization (EUA).  This EUA will remain in effect (meaning this test can be used) for the duration of the COVID-19 declaration under Section 564(b)(1) of the Act, 21 U.S.C. section 360bbb-3(b)(1), unless the authorization is terminated  or revoked sooner.  Performed at [facility], [address] [place], [place] [address]   SARS Coronavirus 2 by RT PCR (hospital order, performed in [facility] hospital lab) Nasopharyngeal Nasopharyngeal Swab     Status: None   Collection Time: 06/13/20  7:02 PM   Specimen: Nasopharyngeal Swab  Result Value Ref Range Status   SARS Coronavirus 2 NEGATIVE NEGATIVE Final    Comment: (NOTE) SARS-CoV-2 target nucleic acids are NOT DETECTED.  The SARS-CoV-2 RNA is generally detectable in upper and lower respiratory specimens during the acute phase of infection. The lowest concentration of SARS-CoV-2 viral copies this assay can detect is 250 copies / mL. A negative result does not preclude SARS-CoV-2 infection and should not be used as the sole basis for treatment or other patient management decisions.  A negative result may occur with improper specimen collection / handling, submission of specimen other than nasopharyngeal swab, presence of viral mutation(s) within the areas targeted by this assay, and inadequate number of viral copies (<250 copies / mL). A negative result must be combined with clinical observations, patient history, and epidemiological information.  Fact Sheet for Patients:   BoilerBrush.com.cy  Fact Sheet for Healthcare Providers: https://pope.com/  This test is not yet approved or  cleared by the [place] FDA and has been authorized for detection and/or diagnosis of SARS-CoV-2 by FDA under an Emergency Use Authorization (EUA).  This EUA will remain in effect (meaning this test can be used) for the duration of the COVID-19 declaration under Section 564(b)(1) of the Act, 21 U.S.C. section 360bbb-3(b)(1), unless the authorization is terminated or revoked sooner.  Performed at [facility], [address] [place], [place] [address]      Radiology Studies: No results found.  Pamella Pert, MD, PhD [facility]  Between 7 am - 7 pm I am available, please contact me via Amion or Securechat  Between 7 pm - 7 am I am not available, please contact night coverage MD/APP via Amion

## 2020-06-19 LAB — CBC
HCT: 35.4 % — ABNORMAL LOW (ref 36.0–46.0)
Hemoglobin: 11.7 g/dL — ABNORMAL LOW (ref 12.0–15.0)
MCH: 33.3 pg (ref 26.0–34.0)
MCHC: 33.1 g/dL (ref 30.0–36.0)
MCV: 100.9 fL — ABNORMAL HIGH (ref 80.0–100.0)
Platelets: 248 10*3/uL (ref 150–400)
RBC: 3.51 MIL/uL — ABNORMAL LOW (ref 3.87–5.11)
RDW: 12.4 % (ref 11.5–15.5)
WBC: 6.7 10*3/uL (ref 4.0–10.5)
nRBC: 0 % (ref 0.0–0.2)

## 2020-06-19 LAB — BASIC METABOLIC PANEL
Anion gap: 9 (ref 5–15)
BUN: 21 mg/dL (ref 8–23)
CO2: 28 mmol/L (ref 22–32)
Calcium: 8.3 mg/dL — ABNORMAL LOW (ref 8.9–10.3)
Chloride: 97 mmol/L — ABNORMAL LOW (ref 98–111)
Creatinine, Ser: 0.67 mg/dL (ref 0.44–1.00)
GFR calc Af Amer: >60 mL/min (ref >60)
GFR calc non Af Amer: >60 mL/min (ref >60)
Glucose, Bld: 331 mg/dL — ABNORMAL HIGH (ref 70–99)
Potassium: 4.2 mmol/L (ref 3.5–5.1)
Sodium: 134 mmol/L — ABNORMAL LOW (ref 135–145)

## 2020-06-19 LAB — SARS CORONAVIRUS 2 BY RT PCR (HOSPITAL ORDER, PERFORMED IN ~~LOC~~ HOSPITAL LAB): SARS Coronavirus 2: NEGATIVE

## 2020-06-19 LAB — GLUCOSE, CAPILLARY
Glucose-Capillary: 299 mg/dL — ABNORMAL HIGH (ref 70–99)
Glucose-Capillary: 251 mg/dL — ABNORMAL HIGH (ref 70–99)

## 2020-06-19 MED ORDER — METFORMIN HCL 500 MG PO TABS: 500.0000 mg | ORAL_TABLET | Freq: Two times a day (BID) | ORAL | 11 refills | Status: AC

## 2020-06-19 MED ORDER — BUDESONIDE 0.25 MG/2ML IN SUSP: 0.2500 mg | Freq: Two times a day (BID) | RESPIRATORY_TRACT | 12 refills | Status: AC

## 2020-06-19 MED ORDER — PREDNISONE 10 MG PO TABS: 30.0000 mg | ORAL_TABLET | Freq: Every day | ORAL | 0 refills | Status: DC

## 2020-06-19 MED ORDER — HYDROCODONE-ACETAMINOPHEN 10-325 MG PO TABS
1.0000 | ORAL_TABLET | Freq: Four times a day (QID) | ORAL | 0 refills | Status: DC | PRN
Start: 2020-06-19 — End: 2023-05-26

## 2020-06-19 NOTE — TOC Transition Note (Signed)
Transition of Care [facility]) - CM/SW Discharge Note   Patient Details  Name: Melissa Burke MRN: [id number] Date of Birth: 10/16/1950  [facility] CM/SW Contact:  Barry Brunner, LCSW Phone Number: 06/19/2020, 2:29 PM   Clinical Narrative:    CSW received discharge summary, order, and Covid Test results for patient. CSW faxed documents to [facility]. CSW confirmed [facility] Person ability to take patient. [facility] agreeable to to take patient. CSW verified with [facility] that they received correct paperwork. CSW contacted [facility]. CSW requested priority transport from [facility]. EMS reported that they noted patient needed to be at facility by 3:00pm. EMS agreeable to provide transport. CSW signing off      Barriers to Discharge: Barriers Resolved   Patient Goals and CMS Choice Patient states their goals for this hospitalization and ongoing recovery are:: short-term SNF prior to return home CMS Medicare.gov Compare Post Acute Care list provided to:: Patient Choice offered to / list presented to : Patient  Discharge Placement              Patient [facility] bed at: [facility] Patient to be transferred to facility by: [facility]   Patient and family notified of of transfer: 06/19/20  Discharge Plan and Services In-house Referral: Clinical Social Work   Post Acute Care Choice: Skilled Nursing Facility                               Social Determinants of Health (SDOH) Interventions     Readmission Risk Interventions Readmission Risk Prevention Plan 06/19/2020  Post Dischage Appt Complete  Medication Screening Complete  Transportation Screening Complete  Some recent data might be hidden

## 2020-06-19 NOTE — Discharge Summary (Signed)
Physician Discharge Summary  Melissa SladeLillian Burke ZOX:[id number] DOB: 1951-02-10 DOA: 06/13/2020  PCP: Phill MyronShabbir, Zonaira, MD  Admit date: 06/13/2020 Discharge date: 06/19/2020  Admitted From: home Disposition:  SNF  Recommendations for Outpatient Follow-up:  1. Follow up with PCP in 1-2 weeks 2. Continue prednisone taper 30 mg daily x 3 days then 20 mg daily x 3 days then 10 mg daily x 3 days then STOP  Home Health: none Equipment/Devices: none  Discharge Condition: stable CODE STATUS: Full code Diet recommendation: regular  HPI: Per admitting MD, Patient is a 69 year old Caucasian female who is up-to-date with her Covid vaccination.  Her medical history is significant for chronic respiratory failure secondary to obstructive sleep apnea on trilogy at home.  She has had recurrent admissions in the past secondary to pneumonia, status post intubations in 2010.  Comorbidities include chronic pain syndrome, hypertension, Raynaud's disease, chronic anemia, restless leg syndrome and status post spinal cord stimulation system. Patient presented with a 2-week history of symptoms of severe headache, recurrent fever and chills now associated with generalized weakness and exertional dyspnea.  During that period, patient did not get tested until this admission.  She denies any known sick contacts.  She lives by self and has been compliant with her medications.  In the ED, Covid PCR test was done x2 came back negative.  CTA scan of the chest however shows evidence of multifocal pneumonia.  Patient was attempted to be ambulated but desaturated below the 90s.  Patient was referred to hospitalist service for further evaluation and management.  Patient denies any chest pain or palpitations at this time.  Denies any nausea or vomiting.  Denies any loss of taste.  Hospital Course / Discharge diagnoses: Principal Problem Acute on chronic hypoxic respiratory failure, multifocal pneumonia, COPD exacerbation, underlying  OSA -patient was admitted to the hospital with multifocal pneumonia on the chest x-ray.  On admission, her PCR Covid testing was negative x2.  She was placed on antibiotics with ceftriaxone and azithromycin and has completed a 6-day course while hospitalized.  CT angiogram of admission did not show any PE.  She was also placed on steroids along with nebulizers.  She improved, her breathing is improved, she will be discharged to SNF in stable condition, still requiring oxygen on discharge.  Of note, at home she is on 4 L at nighttime with her trilogy machine.  Continue nightly CPAP on discharge with 10 cm pressure and 4L o2 bleed in.  In addition, she underwent a 2D echo while hospitalized which showed EF 60-65%, no WMA, grade 1 diastolic dysfunction and normal RV.  Active Problems Essential hypertension -continue home regimen Chronic pain syndrome-continue home regimen, pain appears controlled Restless leg syndrome-continue ropinirole Mild hyponatremia-resolved General deconditioning-to be discharged to SNF/rehab  Discharge Instructions   Allergies as of 06/19/2020      Reactions   Erythromycin Diarrhea   Other    Metal      Medication List    STOP taking these medications   azithromycin 250 MG tablet Commonly known as: ZITHROMAX     TAKE these medications   alendronate 70 MG tablet Commonly known as: FOSAMAX Take 70 mg by mouth every Monday. Take with a full glass of water on an empty stomach in the morning   amLODipine 10 MG tablet Commonly known as: NORVASC Take 10 mg by mouth in the morning.   ammonium lactate 12 % cream Commonly known as: AMLACTIN Apply topically as needed for dry skin.  budesonide 0.25 MG/2ML nebulizer solution Commonly known as: PULMICORT Take 2 mLs (0.25 mg total) by nebulization 2 (two) times daily.   calcium carbonate 1500 (600 Ca) MG Tabs tablet Commonly known as: OSCAL Take 600 mg of elemental calcium by mouth 2 (two) times daily with a  meal.   celecoxib 200 MG capsule Commonly known as: CELEBREX Take 200 mg by mouth at bedtime.   ipratropium-albuterol 0.5-2.5 (3) MG/3ML Soln Commonly known as: DUONEB Take 3 mLs by nebulization every 6 (six) hours as needed (for shortness of breath).   Combivent Respimat 20-100 MCG/ACT Aers respimat Generic drug: Ipratropium-Albuterol Take 1 puff by mouth every 6 (six) hours as needed for wheezing or shortness of breath.   DULoxetine 60 MG capsule Commonly known as: CYMBALTA Take 60 mg by mouth 2 (two) times daily.   HYDROcodone-acetaminophen 10-325 MG tablet Commonly known as: NORCO Take 1 tablet by mouth 4 (four) times daily as needed for moderate pain.   levothyroxine 112 MCG tablet Commonly known as: SYNTHROID Take 112 mcg by mouth in the morning.   loratadine 10 MG tablet Commonly known as: CLARITIN Take 10 mg by mouth in the morning.   losartan 25 MG tablet Commonly known as: COZAAR Take 25 mg by mouth in the morning.   methocarbamol 750 MG tablet Commonly known as: ROBAXIN Take 750 mg by mouth 3 (three) times daily as needed for muscle spasms.   montelukast 10 MG tablet Commonly known as: SINGULAIR Take 10 mg by mouth at bedtime.   Multivitamin Adult Tabs Take 1 tablet by mouth in the morning.   nortriptyline 25 MG capsule Commonly known as: PAMELOR Take 75 mg by mouth at bedtime.   pantoprazole 40 MG tablet Commonly known as: PROTONIX Take 40 mg by mouth 2 (two) times daily.   predniSONE 10 MG tablet Commonly known as: DELTASONE Take 3 tablets (30 mg total) by mouth daily. 30 mg daily x 3 days then 20 mg daily x 3 days then 10 mg daily x 3 days then STOP   rOPINIRole 4 MG tablet Commonly known as: REQUIP Take 4 mg by mouth at bedtime.   traZODone 100 MG tablet Commonly known as: DESYREL Take 300 mg by mouth at bedtime.   vitamin B-12 1000 MCG tablet Commonly known as: CYANOCOBALAMIN Take 1,000 mcg by mouth in the morning.   Vitamin D3 125  MCG (5000 UT) Caps Take 1 capsule by mouth in the morning.       Contact information for after-discharge care    Destination    [facility] .   Service: Skilled Nursing Contact information: [address] [place] [address] [phone]                  Consultations:  None   Procedures/Studies:  CT Angio Chest PE W and/or Wo Contrast  Result Date: 06/13/2020 CLINICAL DATA:  69 year old female with acute shortness of breath and elevated D-dimer. EXAM: CT ANGIOGRAPHY CHEST WITH CONTRAST TECHNIQUE: Multidetector CT imaging of the chest was performed using the standard protocol during bolus administration of intravenous contrast. Multiplanar CT image reconstructions and MIPs were obtained to evaluate the vascular anatomy. CONTRAST:  10mL OMNIPAQUE IOHEXOL 350 MG/ML SOLN COMPARISON:  06/13/2020 chest radiograph. 02/25/2019 thoracic spine CT FINDINGS: Cardiovascular: This is a technically adequate study but respiratory motion artifact decreases sensitivity in the LOWER lungs. No definite pulmonary emboli are identified. Cardiomegaly is noted. No thoracic aortic aneurysm or pericardial effusion identified.  Mediastinum/Nodes: No enlarged mediastinal, hilar, or axillary lymph nodes. Multiple shotty hilar and mediastinal lymph nodes are present. Thyroid gland, trachea, and esophagus demonstrate no significant findings. Lungs/Pleura: Diffuse bilateral ground-glass opacities are noted bilaterally, greatest in the UPPER lobes. No discrete mass, pleural effusion or pneumothorax noted. Upper Abdomen: Diffuse hepatic steatosis noted. A moderate hiatal hernia is identified. Musculoskeletal: No acute or suspicious bony abnormalities are identified. A thoracic cord stimulator is present. A RIGHT breast prosthesis and LEFT breast surgical changes noted. Review of the MIP images confirms the above findings. IMPRESSION: 1. No evidence of pulmonary emboli or thoracic  aortic aneurysm. 2. Diffuse bilateral ground-glass opacities, greatest in the UPPER lobes, nonspecific but suspicious for infection/pneumonia. Clinical/radiographic follow-up is recommended. 3. Cardiomegaly. 4. Hepatic steatosis. 5. Moderate hiatal hernia. Electronically Signed   By: Harmon Pier M.D.   On: 06/13/2020 17:57   DG Chest Port 1 View  Result Date: 06/13/2020 CLINICAL DATA:  Shortness of breath, lungs burning, onset of symptoms 2 weeks ago, COVID-19 test results pending; history asthma, chronic respiratory failure EXAM: PORTABLE CHEST 1 VIEW COMPARISON:  Portable exam 1235 hours without priors for comparison FINDINGS: Intraspinal stimulator projects over lower thoracic spine. Normal heart size and pulmonary vascularity. Moderate-sized hiatal hernia. Patchy infiltrates throughout both lungs greatest in RIGHT upper lobe favor pneumonia. No pleural effusion or pneumothorax. IMPRESSION: Patchy BILATERAL pulmonary infiltrates greatest in RIGHT upper lobe, favor pneumonia. Hiatal hernia. Electronically Signed   By: Ulyses Southward M.D.   On: 06/13/2020 12:52   ECHOCARDIOGRAM COMPLETE  Result Date: 06/14/2020    ECHOCARDIOGRAM REPORT   Patient Name:   Melissa Burke Date of Exam: 06/14/2020 Medical Rec #:  [id number]        Height:       66.0 in Accession #:    [id number]       Weight:       190.0 lb Date of Birth:  10-18-1950        BSA:          1.957 m Patient Age:    69 years         BP:           137/73 mmHg Patient Gender: F                HR:           91 bpm. [facility] Procedure: 2D Echo, Cardiac Doppler and Color Doppler Indications:    Dyspnea 786.09 / R06.00  History:        Patient has no prior history of Echocardiogram examinations.                 Risk Factors:Hypertension. Chronic respiratory failure (HCC)                 (From Hx), Sleep apnea (From Hx).  Sonographer:    Celesta Gentile RCS Referring Phys: [id number] [facility] IMPRESSIONS  1. Left ventricular ejection  fraction, by estimation, is 60 to 65%. The left ventricle has normal function. The left ventricle has no regional wall motion abnormalities. Left ventricular diastolic parameters are consistent with Grade I diastolic dysfunction (impaired relaxation).  2. Right ventricular systolic function is normal. The right ventricular size is normal. There is normal pulmonary artery systolic pressure.  3. Left atrial size was severely dilated.  4. The mitral valve is normal in structure. Trivial mitral valve regurgitation. No evidence of mitral stenosis.  5. The aortic valve is tricuspid.  Aortic valve regurgitation is not visualized. No aortic stenosis is present.  6. The inferior vena cava is dilated in size with >50% respiratory variability, suggesting right atrial pressure of 8 mmHg. FINDINGS  Left Ventricle: Left ventricular ejection fraction, by estimation, is 60 to 65%. The left ventricle has normal function. The left ventricle has no regional wall motion abnormalities. The left ventricular internal cavity size was normal in size. There is  no left ventricular hypertrophy. Left ventricular diastolic parameters are consistent with Grade I diastolic dysfunction (impaired relaxation). Normal left ventricular filling pressure. Right Ventricle: The right ventricular size is normal. No increase in right ventricular wall thickness. Right ventricular systolic function is normal. There is normal pulmonary artery systolic pressure. The tricuspid regurgitant velocity is 2.37 m/s, and  with an assumed right atrial pressure of 8 mmHg, the estimated right ventricular systolic pressure is 30.5 mmHg. Left Atrium: Left atrial size was severely dilated. Right Atrium: Right atrial size was normal in size. Pericardium: There is no evidence of pericardial effusion. Mitral Valve: The mitral valve is normal in structure. Trivial mitral valve regurgitation. No evidence of mitral valve stenosis. Tricuspid Valve: The tricuspid valve is normal in  structure. Tricuspid valve regurgitation is trivial. No evidence of tricuspid stenosis. Aortic Valve: The aortic valve is tricuspid. Aortic valve regurgitation is not visualized. No aortic stenosis is present. Aortic valve mean gradient measures 4.5 mmHg. Aortic valve peak gradient measures 8.3 mmHg. Aortic valve area, by VTI measures 2.66 cm. Pulmonic Valve: The pulmonic valve was not well visualized. Pulmonic valve regurgitation is not visualized. No evidence of pulmonic stenosis. Aorta: The aortic root is normal in size and structure. Venous: The inferior vena cava is dilated in size with greater than 50% respiratory variability, suggesting right atrial pressure of 8 mmHg. IAS/Shunts: No atrial level shunt detected by color flow Doppler.  LEFT VENTRICLE PLAX 2D LVIDd:         5.30 cm  Diastology LVIDs:         3.62 cm  LV e' lateral:   9.46 cm/s LV PW:         0.84 cm  LV E/e' lateral: 7.8 LV IVS:        0.98 cm  LV e' medial:    8.38 cm/s LVOT diam:     2.00 cm  LV E/e' medial:  8.8 LV SV:         79 LV SV Index:   40 LVOT Area:     3.14 cm  RIGHT VENTRICLE RV S prime:     12.50 cm/s TAPSE (M-mode): 2.3 cm LEFT ATRIUM              Index       RIGHT ATRIUM           Index LA diam:        3.10 cm  1.58 cm/m  RA Area:     14.40 cm LA Vol (A2C):   62.4 ml  31.89 ml/m RA Volume:   34.80 ml  17.79 ml/m LA Vol (A4C):   106.0 ml 54.17 ml/m LA Biplane Vol: 83.6 ml  42.73 ml/m  AORTIC VALVE AV Area (Vmax):    2.85 cm AV Area (Vmean):   2.34 cm AV Area (VTI):     2.66 cm AV Vmax:           144.29 cm/s AV Vmean:          98.732 cm/s AV VTI:  0.295 m AV Peak Grad:      8.3 mmHg AV Mean Grad:      4.5 mmHg LVOT Vmax:         131.00 cm/s LVOT Vmean:        73.500 cm/s LVOT VTI:          0.250 m LVOT/AV VTI ratio: 0.85  AORTA Ao Root diam: 3.10 cm MITRAL VALVE                TRICUSPID VALVE MV Area (PHT): 2.61 cm     TR Peak grad:   22.5 mmHg MV Decel Time: 291 msec     TR Vmax:        237.00 cm/s MV E  velocity: 73.70 cm/s MV A velocity: 105.00 cm/s  SHUNTS MV E/A ratio:  0.70         Systemic VTI:  0.25 m                             Systemic Diam: 2.00 cm Dina Rich MD Electronically signed by Dina Rich MD Signature Date/Time: 06/14/2020/3:02:14 PM    Final       Subjective: -Feels much better, still dyspneic with activity but overall much improved  Discharge Exam: BP (!) 153/89 (BP Location: Left Arm)   Pulse 90   Temp 98.3 F (36.8 C) (Oral)   Resp 20   Ht 5\' 6"  (1.676 m)   Wt 87.9 kg   SpO2 90%   BMI 31.28 kg/m   General: Pt is alert, awake, not in acute distress Cardiovascular: RRR, S1/S2 +, no rubs, no gallops Respiratory: CTA bilaterally, no wheezing, no rhonchi Abdominal: Soft, NT, ND, bowel sounds + Extremities: no edema, no cyanosis   The results of significant diagnostics from this hospitalization (including imaging, microbiology, ancillary and laboratory) are listed below for reference.     Microbiology: Recent Results (from the past 240 hour(s))  SARS Coronavirus 2 by RT PCR (hospital order, performed in [facility] hospital lab) Nasopharyngeal Nasopharyngeal Swab     Status: None   Collection Time: 06/13/20 12:21 PM   Specimen: Nasopharyngeal Swab  Result Value Ref Range Status   SARS Coronavirus 2 NEGATIVE NEGATIVE Final    Comment: (NOTE) SARS-CoV-2 target nucleic acids are NOT DETECTED.  The SARS-CoV-2 RNA is generally detectable in upper and lower respiratory specimens during the acute phase of infection. The lowest concentration of SARS-CoV-2 viral copies this assay can detect is 250 copies / mL. A negative result does not preclude SARS-CoV-2 infection and should not be used as the sole basis for treatment or other patient management decisions.  A negative result may occur with improper specimen collection / handling, submission of specimen other than nasopharyngeal swab, presence of viral mutation(s) within the areas targeted by this  assay, and inadequate number of viral copies (<250 copies / mL). A negative result must be combined with clinical observations, patient history, and epidemiological information.  Fact Sheet for Patients:   06/15/20  Fact Sheet for Healthcare Providers: BoilerBrush.com.cy  This test is not yet approved or  cleared by the https://pope.com/ FDA and has been authorized for detection and/or diagnosis of SARS-CoV-2 by FDA under an Emergency Use Authorization (EUA).  This EUA will remain in effect (meaning this test can be used) for the duration of the COVID-19 declaration under Section 564(b)(1) of the Act, 21 U.S.C. section 360bbb-3(b)(1), unless the authorization is terminated or  revoked sooner.  Performed at [facility], [address], [place], [place] [address]   SARS Coronavirus 2 by RT PCR (hospital order, performed in [facility] hospital lab) Nasopharyngeal Nasopharyngeal Swab     Status: None   Collection Time: 06/13/20  7:02 PM   Specimen: Nasopharyngeal Swab  Result Value Ref Range Status   SARS Coronavirus 2 NEGATIVE NEGATIVE Final    Comment: (NOTE) SARS-CoV-2 target nucleic acids are NOT DETECTED.  The SARS-CoV-2 RNA is generally detectable in upper and lower respiratory specimens during the acute phase of infection. The lowest concentration of SARS-CoV-2 viral copies this assay can detect is 250 copies / mL. A negative result does not preclude SARS-CoV-2 infection and should not be used as the sole basis for treatment or other patient management decisions.  A negative result may occur with improper specimen collection / handling, submission of specimen other than nasopharyngeal swab, presence of viral mutation(s) within the areas targeted by this assay, and inadequate number of viral copies (<250 copies / mL). A negative result must be combined with clinical observations, patient history, and epidemiological  information.  Fact Sheet for Patients:   BoilerBrush.com.cy  Fact Sheet for Healthcare Providers: https://pope.com/  This test is not yet approved or  cleared by the [place] FDA and has been authorized for detection and/or diagnosis of SARS-CoV-2 by FDA under an Emergency Use Authorization (EUA).  This EUA will remain in effect (meaning this test can be used) for the duration of the COVID-19 declaration under Section 564(b)(1) of the Act, 21 U.S.C. section 360bbb-3(b)(1), unless the authorization is terminated or revoked sooner.  Performed at [facility], [address] [place], [place] [address]      Labs: Basic Metabolic Panel: Recent Labs  Lab 06/14/20 0314 06/15/20 0316 06/16/20 0601 06/17/20 0819 06/19/20 0655  NA 139 136 139 133* 134*  K 3.7 3.9 4.5 4.0 4.2  CL 99 97* 99 97* 97*  CO2 26 24 29 22 28   GLUCOSE 206* 342* 255* 264* 331*  BUN 10 12 23 18 21   CREATININE 0.61 0.57 0.74 0.61 0.67  CALCIUM 9.3 9.2 9.8 9.1 8.3*  MG  --  2.0  --   --   --    Liver Function Tests: Recent Labs  Lab 06/14/20 0314 06/15/20 0316 06/16/20 0601  AST 23 21 46*  ALT 27 29 52*  ALKPHOS 94 101 111  BILITOT 0.7 0.7 0.7  PROT 6.9 6.8 7.0  ALBUMIN 3.3* 3.3* 3.2*   CBC: Recent Labs  Lab 06/13/20 1431 06/13/20 1431 06/14/20 0314 06/15/20 0316 06/16/20 0601 06/17/20 0819 06/19/20 0655  WBC 7.1   < > 5.4 5.3 8.6 7.0 6.7  NEUTROABS 4.9  --   --  4.9  --   --   --   HGB 12.6   < > 12.2 12.2 12.4 12.3 11.7*  HCT 38.6   < > 37.7 37.2 38.2 36.1 35.4*  MCV 101.0*   < > 102.2* 101.1* 101.6* 97.3 100.9*  PLT 202   < > 200 206 264 231 248   < > = values in this interval not displayed.   CBG: Recent Labs  Lab 06/18/20 0716 06/18/20 1108 06/18/20 1627 06/18/20 2159 06/19/20 0733  GLUCAP 292* 307* 336* 300* 299*   Hgb A1c No results for input(s): HGBA1C in the last 72 hours. Lipid Profile No results for input(s):  CHOL, HDL, LDLCALC, TRIG, CHOLHDL, LDLDIRECT in the last 72 hours. Thyroid function studies No  results for input(s): TSH, T4TOTAL, T3FREE, THYROIDAB in the last 72 hours.  Invalid input(s): FREET3 Urinalysis    Component Value Date/Time   COLORURINE YELLOW 09/21/2019 1620   APPEARANCEUR CLEAR 09/21/2019 1620   LABSPEC 1.008 09/21/2019 1620   PHURINE 6.0 09/21/2019 1620   GLUCOSEU NEGATIVE 09/21/2019 1620   HGBUR NEGATIVE 09/21/2019 1620   BILIRUBINUR NEGATIVE 09/21/2019 1620   KETONESUR NEGATIVE 09/21/2019 1620   PROTEINUR NEGATIVE 09/21/2019 1620   NITRITE NEGATIVE 09/21/2019 1620   LEUKOCYTESUR NEGATIVE 09/21/2019 1620    FURTHER DISCHARGE INSTRUCTIONS:   Get Medicines reviewed and adjusted: Please take all your medications with you for your next visit with your Primary MD   Laboratory/radiological data: Please request your Primary MD to go over all hospital tests and procedure/radiological results at the follow up, please ask your Primary MD to get all Hospital records sent to his/her office.   In some cases, they will be blood work, cultures and biopsy results pending at the time of your discharge. Please request that your primary care M.D. goes through all the records of your hospital data and follows up on these results.   Also Note the following: If you experience worsening of your admission symptoms, develop shortness of breath, life threatening emergency, suicidal or homicidal thoughts you must seek medical attention immediately by calling 911 or calling your MD immediately  if symptoms less severe.   You must read complete instructions/literature along with all the possible adverse reactions/side effects for all the Medicines you take and that have been prescribed to you. Take any new Medicines after you have completely understood and accpet all the possible adverse reactions/side effects.    Do not drive when taking Pain medications or sleeping medications  (Benzodaizepines)   Do not take more than prescribed Pain, Sleep and Anxiety Medications. It is not advisable to combine anxiety,sleep and pain medications without talking with your primary care practitioner   Special Instructions: If you have smoked or chewed Tobacco  in the last 2 yrs please stop smoking, stop any regular Alcohol  and or any Recreational drug use.   Wear Seat belts while driving.   Please note: You were cared for by a hospitalist during your hospital stay. Once you are discharged, your primary care physician will handle any further medical issues. Please note that NO REFILLS for any discharge medications will be authorized once you are discharged, as it is imperative that you return to your primary care physician (or establish a relationship with a primary care physician if you do not have one) for your post hospital discharge needs so that they can reassess your need for medications and monitor your lab values.  Time coordinating discharge: 35 minutes  SIGNED:  Pamella Pert, MD, PhD 06/19/2020, 9:23 AM

## 2020-06-19 NOTE — Plan of Care (Signed)

## 2020-10-06 ENCOUNTER — Emergency Department (HOSPITAL_COMMUNITY)
Admission: EM | Admit: 2020-10-06 | Discharge: 2020-10-07 | Disposition: A | Discharge status: Home / Self Care | Payer: Medicare Other | Attending: Emergency Medicine | Admitting: Emergency Medicine

## 2020-10-06 ENCOUNTER — Other Ambulatory Visit: Payer: Self-pay

## 2020-10-06 DIAGNOSIS — F3341 Major depressive disorder, recurrent, in partial remission: Secondary | ICD-10-CM | POA: Diagnosis not present

## 2020-10-06 DIAGNOSIS — T1491XA Suicide attempt, initial encounter: Secondary | ICD-10-CM | POA: Diagnosis not present

## 2020-10-06 DIAGNOSIS — Z23 Encounter for immunization: Secondary | ICD-10-CM | POA: Insufficient documentation

## 2020-10-06 DIAGNOSIS — F338 Other recurrent depressive disorders: Secondary | ICD-10-CM

## 2020-10-06 DIAGNOSIS — Z9981 Dependence on supplemental oxygen: Secondary | ICD-10-CM | POA: Diagnosis not present

## 2020-10-06 DIAGNOSIS — R45851 Suicidal ideations: Secondary | ICD-10-CM | POA: Diagnosis not present

## 2020-10-06 DIAGNOSIS — F4310 Post-traumatic stress disorder, unspecified: Secondary | ICD-10-CM | POA: Insufficient documentation

## 2020-10-06 DIAGNOSIS — Z20822 Contact with and (suspected) exposure to covid-19: Secondary | ICD-10-CM | POA: Insufficient documentation

## 2020-10-06 DIAGNOSIS — X781XXA Intentional self-harm by knife, initial encounter: Secondary | ICD-10-CM | POA: Diagnosis not present

## 2020-10-06 DIAGNOSIS — S199XXA Unspecified injury of neck, initial encounter: Secondary | ICD-10-CM | POA: Diagnosis present

## 2020-10-06 DIAGNOSIS — S1091XA Abrasion of unspecified part of neck, initial encounter: Secondary | ICD-10-CM | POA: Insufficient documentation

## 2020-10-06 DIAGNOSIS — J441 Chronic obstructive pulmonary disease with (acute) exacerbation: Secondary | ICD-10-CM | POA: Insufficient documentation

## 2020-10-06 LAB — CBC
HCT: 42.4 % (ref 36.0–46.0)
Hemoglobin: 13.6 g/dL (ref 12.0–15.0)
MCH: 31.3 pg (ref 26.0–34.0)
MCHC: 32.1 g/dL (ref 30.0–36.0)
MCV: 97.7 fL (ref 80.0–100.0)
Platelets: 246 10*3/uL (ref 150–400)
RBC: 4.34 MIL/uL (ref 3.87–5.11)
RDW: 14.6 % (ref 11.5–15.5)
WBC: 13.7 10*3/uL — ABNORMAL HIGH (ref 4.0–10.5)
nRBC: 0 % (ref 0.0–0.2)

## 2020-10-06 LAB — RESP PANEL BY RT-PCR (FLU A&B, COVID) ARPGX2
Influenza A by PCR: NEGATIVE
Influenza B by PCR: NEGATIVE
SARS Coronavirus 2 by RT PCR: NEGATIVE

## 2020-10-06 LAB — COMPREHENSIVE METABOLIC PANEL
ALT: 37 U/L (ref 0–44)
AST: 29 U/L (ref 15–41)
Albumin: 4.2 g/dL (ref 3.5–5.0)
Alkaline Phosphatase: 95 U/L (ref 38–126)
Anion gap: 10 (ref 5–15)
BUN: 30 mg/dL — ABNORMAL HIGH (ref 8–23)
CO2: 25 mmol/L (ref 22–32)
Calcium: 9.3 mg/dL (ref 8.9–10.3)
Chloride: 101 mmol/L (ref 98–111)
Creatinine, Ser: 0.87 mg/dL (ref 0.44–1.00)
GFR, Estimated: >60 mL/min (ref >60)
Glucose, Bld: 118 mg/dL — ABNORMAL HIGH (ref 70–99)
Potassium: 4.5 mmol/L (ref 3.5–5.1)
Sodium: 136 mmol/L (ref 135–145)
Total Bilirubin: 0.8 mg/dL (ref 0.3–1.2)
Total Protein: 7.3 g/dL (ref 6.5–8.1)

## 2020-10-06 LAB — ACETAMINOPHEN LEVEL: Acetaminophen (Tylenol), Serum: <10 ug/mL — ABNORMAL LOW (ref 10–30)

## 2020-10-06 LAB — RAPID URINE DRUG SCREEN, HOSP PERFORMED
Amphetamines: NOT DETECTED
Barbiturates: NOT DETECTED
Benzodiazepines: NOT DETECTED
Cocaine: NOT DETECTED
Opiates: POSITIVE — AB
Tetrahydrocannabinol: NOT DETECTED

## 2020-10-06 LAB — SALICYLATE LEVEL: Salicylate Lvl: <7 mg/dL — ABNORMAL LOW (ref 7.0–30.0)

## 2020-10-06 LAB — ETHANOL: Alcohol, Ethyl (B): <10 mg/dL (ref <10)

## 2020-10-06 MED ORDER — METHOCARBAMOL 500 MG PO TABS
750.0000 mg | ORAL_TABLET | Freq: Three times a day (TID) | ORAL | Status: DC | PRN
  Administered 2020-10-06 – 2020-10-07 (×3): 750 mg via ORAL
  Filled 2020-10-06 (×3): qty 2

## 2020-10-06 MED ORDER — BUDESONIDE 0.25 MG/2ML IN SUSP
0.2500 mg | Freq: Two times a day (BID) | RESPIRATORY_TRACT | Status: DC
  Administered 2020-10-06 – 2020-10-07 (×2): 0.25 mg via RESPIRATORY_TRACT
  Filled 2020-10-06 (×2): qty 2

## 2020-10-06 MED ORDER — PANTOPRAZOLE SODIUM 40 MG PO TBEC
40.0000 mg | DELAYED_RELEASE_TABLET | Freq: Two times a day (BID) | ORAL | Status: DC
  Administered 2020-10-06 – 2020-10-07 (×2): 40 mg via ORAL
  Filled 2020-10-06 (×2): qty 1

## 2020-10-06 MED ORDER — AMLODIPINE BESYLATE 5 MG PO TABS
10.0000 mg | ORAL_TABLET | Freq: Every morning | ORAL | Status: DC
Start: 2020-10-07 — End: 2020-10-07
  Administered 2020-10-07: 09:00:00 10 mg via ORAL
  Filled 2020-10-06 (×2): qty 2

## 2020-10-06 MED ORDER — LORATADINE 10 MG PO TABS
10.0000 mg | ORAL_TABLET | Freq: Every day | ORAL | Status: DC
  Administered 2020-10-07: 09:00:00 10 mg via ORAL
  Filled 2020-10-06: qty 1

## 2020-10-06 MED ORDER — NORTRIPTYLINE HCL 25 MG PO CAPS
75.0000 mg | ORAL_CAPSULE | Freq: Every day | ORAL | Status: DC
  Administered 2020-10-06: 23:00:00 75 mg via ORAL
  Filled 2020-10-06 (×2): qty 3

## 2020-10-06 MED ORDER — ONDANSETRON HCL 4 MG PO TABS: 4.0000 mg | ORAL_TABLET | Freq: Three times a day (TID) | ORAL | Status: DC | PRN

## 2020-10-06 MED ORDER — LEVOTHYROXINE SODIUM 112 MCG PO TABS
112.0000 ug | ORAL_TABLET | Freq: Every morning | ORAL | Status: DC
  Administered 2020-10-07: 07:00:00 112 ug via ORAL
  Filled 2020-10-06: qty 1

## 2020-10-06 MED ORDER — METFORMIN HCL 500 MG PO TABS
500.0000 mg | ORAL_TABLET | Freq: Two times a day (BID) | ORAL | Status: DC
Start: 2020-10-07 — End: 2020-10-07
  Administered 2020-10-07: 09:00:00 500 mg via ORAL
  Filled 2020-10-06: qty 1

## 2020-10-06 MED ORDER — MONTELUKAST SODIUM 10 MG PO TABS
10.0000 mg | ORAL_TABLET | Freq: Every day | ORAL | Status: DC
  Administered 2020-10-06: 23:00:00 10 mg via ORAL
  Filled 2020-10-06 (×2): qty 1

## 2020-10-06 MED ORDER — IPRATROPIUM-ALBUTEROL 20-100 MCG/ACT IN AERS: 1.0000 | INHALATION_SPRAY | Freq: Four times a day (QID) | RESPIRATORY_TRACT | Status: DC | PRN

## 2020-10-06 MED ORDER — TETANUS-DIPHTH-ACELL PERTUSSIS 5-2.5-18.5 LF-MCG/0.5 IM SUSY
0.5000 mL | PREFILLED_SYRINGE | Freq: Once | INTRAMUSCULAR | Status: AC
  Administered 2020-10-06: 20:00:00 0.5 mL via INTRAMUSCULAR
  Filled 2020-10-06: qty 0.5

## 2020-10-06 MED ORDER — ALENDRONATE SODIUM 70 MG PO TABS: 70.0000 mg | ORAL_TABLET | ORAL | Status: DC

## 2020-10-06 MED ORDER — TRAZODONE HCL 50 MG PO TABS
300.0000 mg | ORAL_TABLET | Freq: Every day | ORAL | Status: DC
  Administered 2020-10-06: 23:00:00 300 mg via ORAL
  Filled 2020-10-06: qty 3

## 2020-10-06 MED ORDER — HYDROCODONE-ACETAMINOPHEN 10-325 MG PO TABS
1.0000 | ORAL_TABLET | Freq: Four times a day (QID) | ORAL | Status: DC | PRN
  Administered 2020-10-06 – 2020-10-07 (×4): 1 via ORAL
  Filled 2020-10-06 (×5): qty 1

## 2020-10-06 MED ORDER — LOSARTAN POTASSIUM 50 MG PO TABS
25.0000 mg | ORAL_TABLET | Freq: Every morning | ORAL | Status: DC
  Administered 2020-10-07: 09:00:00 25 mg via ORAL
  Filled 2020-10-06: qty 1

## 2020-10-06 MED ORDER — DULOXETINE HCL 60 MG PO CPEP
60.0000 mg | ORAL_CAPSULE | Freq: Two times a day (BID) | ORAL | Status: DC
Start: 2020-10-06 — End: 2020-10-07
  Administered 2020-10-06 – 2020-10-07 (×2): 60 mg via ORAL
  Filled 2020-10-06 (×3): qty 1

## 2020-10-06 MED ORDER — ROPINIROLE HCL 1 MG PO TABS
4.0000 mg | ORAL_TABLET | Freq: Every day | ORAL | Status: DC
  Administered 2020-10-06: 23:00:00 4 mg via ORAL
  Filled 2020-10-06 (×2): qty 4

## 2020-10-06 MED ORDER — CELECOXIB 200 MG PO CAPS
200.0000 mg | ORAL_CAPSULE | Freq: Every day | ORAL | Status: DC
  Administered 2020-10-06: 23:00:00 200 mg via ORAL
  Filled 2020-10-06 (×2): qty 1

## 2020-10-06 MED ORDER — ALUM & MAG HYDROXIDE-SIMETH 200-200-20 MG/5ML PO SUSP: 30.0000 mL | Freq: Four times a day (QID) | ORAL | Status: DC | PRN
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
## 2020-10-06 NOTE — ED Provider Notes (Signed)
Emergency Department Provider Note   I have reviewed the triage vital signs and the nursing notes.   HISTORY  Chief Complaint Suicidal   HPI Melissa Burke is a 69 y.o. female with past medical history reviewed below presents to the emergency department for evaluation after suicide attempt at home last night. Patient reports that she has PTSD as well as SAD and is currently in a Keyontay Stolz-distance relationship. She states that her significant other has not spoken to her in several days and she was feeling increasingly depressed along with the seasonal changes. She ultimately felt like "what was the point in going on" and attempted to cut her throat with a kitchen knife last night. She states that she made several attempts but was unable to draw blood. She tells me that she was thinking about a movie whereby people committed suicide in this manner. She does have a large number of pain medications at home but did not take these in an overdose attempt. She did drink alcohol last night but does not drink regularly. She denies any drug use. No auditory or visual hallucinations. She has no medical complaints at this time. She is on baseline supplemental oxygen after recent pneumonia and OSA.     Past Medical History:  Diagnosis Date  . Asthma   . Avascular necrosis (HCC)   . Chronic pain syndrome   . Chronic respiratory failure (HCC)   . Compression fracture of T12 vertebra (HCC)   . Osteoarthritis   . Osteopenia   . PTSD (post-traumatic stress disorder)   . Restless leg syndrome   . Severe anemia   . Sleep apnea     Patient Active Problem List   Diagnosis Date Noted  . COPD exacerbation (HCC) 06/15/2020  . Pneumonia 06/13/2020    Past Surgical History:  Procedure Laterality Date  . ABDOMINAL HYSTERECTOMY    . BLADDER REPAIR    . BREAST SURGERY    . FRACTURE SURGERY    . IR VERTEBROPLASTY CERV/THOR BX INC UNI/BIL INC/INJECT/IMAGING  03/19/2019    Allergies Erythromycin and  Other  Family History  Problem Relation Age of Onset  . Emphysema Mother   . Cancer Sister   . Heart attack Other   . Colon cancer Other     Social History Social History   Tobacco Use  . Smoking status: Never Smoker  . Smokeless tobacco: Never Used  Vaping Use  . Vaping Use: Never used  Substance Use Topics  . Alcohol use: Never  . Drug use: Never    Review of Systems  Constitutional: No fever/chills Eyes: No visual changes. ENT: No sore throat. Cardiovascular: Denies chest pain. Respiratory: Denies shortness of breath. Gastrointestinal: No abdominal pain.  No nausea, no vomiting.  No diarrhea.  No constipation. Genitourinary: Negative for dysuria. Musculoskeletal: Negative for back pain. Skin: Negative for rash. Multiple superficial scrapes to neck.  Neurological: Negative for headaches, focal weakness or numbness. Psychiatric: Positive SI with attempt and worsening depression.   10-point ROS otherwise negative.  ____________________________________________   PHYSICAL EXAM:  VITAL SIGNS: ED Triage Vitals  Enc Vitals Group     BP 10/06/20 1556 (!) 165/132     Pulse Rate 10/06/20 1556 (!) 132     Resp 10/06/20 1556 (!) 22     Temp 10/06/20 1556 98.2 F (36.8 C)     Temp Source 10/06/20 1556 Oral     SpO2 10/06/20 1556 96 %     Weight 10/06/20 1556 194 lb (  88 kg)     Height 10/06/20 1556 5\' 5"  (1.651 m)   Constitutional: Alert and oriented. Well appearing and in no acute distress. Eyes: Conjunctivae are normal.  Head: Atraumatic. Nose: No congestion/rhinnorhea. Mouth/Throat: Mucous membranes are moist.  Oropharynx non-erythematous. Neck: No stridor. Six, superficial linear abrasions to the left lateral neck. No hematoma.  Cardiovascular: Normal rate, regular rhythm. Good peripheral circulation. Grossly normal heart sounds.   Respiratory: Normal respiratory effort.  No retractions. Lungs CTAB. Gastrointestinal: Soft and nontender. No distention.   Musculoskeletal: No lower extremity tenderness nor edema. No gross deformities of extremities. Neurologic:  Normal speech and language. No gross focal neurologic deficits are appreciated.  Skin:  Skin is warm, dry and intact. No rash noted. Psychiatric: Mood and affect are normal. Speech and behavior are normal.  ____________________________________________   LABS (all labs ordered are listed, but only abnormal results are displayed)  Labs Reviewed  COMPREHENSIVE METABOLIC PANEL - Abnormal; Notable for the following components:      Result Value   Glucose, Bld 118 (*)    BUN 30 (*)    All other components within normal limits  SALICYLATE LEVEL - Abnormal; Notable for the following components:   Salicylate Lvl <7.0 (*)    All other components within normal limits  ACETAMINOPHEN LEVEL - Abnormal; Notable for the following components:   Acetaminophen (Tylenol), Serum <10 (*)    All other components within normal limits  CBC - Abnormal; Notable for the following components:   WBC 13.7 (*)    All other components within normal limits  RAPID URINE DRUG SCREEN, HOSP PERFORMED - Abnormal; Notable for the following components:   Opiates POSITIVE (*)    All other components within normal limits  RESP PANEL BY RT-PCR (FLU A&B, COVID) ARPGX2  ETHANOL   ____________________________________________   PROCEDURES  Procedure(s) performed:   Procedures  None  ____________________________________________   INITIAL IMPRESSION / ASSESSMENT AND PLAN / ED COURSE  Pertinent labs & imaging results that were available during my care of the patient were reviewed by me and considered in my medical decision making (see chart for details).   Patient presents to the emergency department for evaluation after suicide attempt last night. She has multiple superficial linear abrasions to the left lateral neck but no hematomas, contusions. No break in the skin requiring suture or other wound care. Will  update tetanus. Plan for medicine reconciliation and will start her home medications back here. Patient is here voluntarily. Will need TTS evaluation.   Labs reviewed. Patient medically clear for TTS evaluation.  ____________________________________________  FINAL CLINICAL IMPRESSION(S) / ED DIAGNOSES  Final diagnoses:  Suicidal ideation     MEDICATIONS GIVEN DURING THIS VISIT:  Medications  HYDROcodone-acetaminophen (NORCO) 10-325 MG per tablet 1 tablet (1 tablet Oral Given 10/06/20 1817)  methocarbamol (ROBAXIN) tablet 750 mg (750 mg Oral Given 10/06/20 1817)  ondansetron (ZOFRAN) tablet 4 mg (has no administration in time range)  alum & mag hydroxide-simeth (MAALOX/MYLANTA) 200-200-20 MG/5ML suspension 30 mL (has no administration in time range)  metFORMIN (GLUCOPHAGE) tablet 500 mg (has no administration in time range)  alendronate (FOSAMAX) tablet 70 mg (has no administration in time range)  amLODipine (NORVASC) tablet 10 mg (has no administration in time range)  budesonide (PULMICORT) nebulizer solution 0.25 mg (has no administration in time range)  celecoxib (CELEBREX) capsule 200 mg (has no administration in time range)  DULoxetine (CYMBALTA) DR capsule 60 mg (has no administration in time range)  Ipratropium-Albuterol (COMBIVENT)  respimat 1 puff (has no administration in time range)  levothyroxine (SYNTHROID) tablet 112 mcg (has no administration in time range)  loratadine (CLARITIN) tablet 10 mg (has no administration in time range)  losartan (COZAAR) tablet 25 mg (has no administration in time range)  montelukast (SINGULAIR) tablet 10 mg (has no administration in time range)  nortriptyline (PAMELOR) capsule 75 mg (has no administration in time range)  pantoprazole (PROTONIX) EC tablet 40 mg (has no administration in time range)  rOPINIRole (REQUIP) tablet 4 mg (has no administration in time range)  traZODone (DESYREL) tablet 300 mg (has no administration in time range)   Tdap (BOOSTRIX) injection 0.5 mL (0.5 mLs Intramuscular Given 10/06/20 1946)    Note:  This document was prepared using Dragon voice recognition software and may include unintentional dictation errors.  Alona Bene, MD, [facility]    Kemi Gell, Arlyss Repress, MD 10/06/20 2206

## 2020-10-06 NOTE — Progress Notes (Signed)
PHARMACIST - PHYSICIAN COMMUNICATION  CONCERNING: P&T Medication Policy Regarding Oral Bisphosphonates  RECOMMENDATION: Your order for alendronate (Fosamax), ibandronate (Boniva), or risedronate (Actonel) has been discontinued at this time.  If the patient's post-hospital medical condition warrants safe use of this class of drugs, please resume the pre-hospital regimen upon discharge.  DESCRIPTION:  Alendronate (Fosamax), ibandronate (Boniva), and risedronate (Actonel) can cause severe esophageal erosions in patients who are unable to remain upright at least 30 minutes after taking this medication.   Since brief interruptions in therapy are thought to have minimal impact on bone mineral density, the Pharmacy & Therapeutics Committee has established that bisphosphonate orders should be routinely discontinued during hospitalization.   To override this safety policy and permit administration of Boniva, Fosamax, or Actonel in the hospital, prescribers must write "DO NOT HOLD" in the comments section when placing the order for this class of medications.  Christoper Fabian, PharmD, BCPS Please see amion for complete clinical pharmacist phone list 10/06/2020 10:14 PM

## 2020-10-06 NOTE — ED Notes (Signed)
Pt in yellow 36 at this time for TTS.

## 2020-10-06 NOTE — ED Triage Notes (Addendum)
Pt presents to ED POV. Pt c/o SI w/ suicide attempt. Pt attempted to cut throat last night. Scratch marks on throat but skin is intact. Pt states that she is lonely and fighting w/ significant other. Hx PTSD

## 2020-10-06 NOTE — ED Notes (Signed)
Belongings (1 bag and 1 oxygen tank) secured in [address] [place].

## 2020-10-06 NOTE — ED Notes (Signed)
Requested pt for urine sample.

## 2020-10-07 DIAGNOSIS — S1091XA Abrasion of unspecified part of neck, initial encounter: Secondary | ICD-10-CM | POA: Diagnosis not present

## 2020-10-07 DIAGNOSIS — T1491XA Suicide attempt, initial encounter: Secondary | ICD-10-CM | POA: Diagnosis not present

## 2020-10-07 DIAGNOSIS — F338 Other recurrent depressive disorders: Secondary | ICD-10-CM

## 2020-10-07 DIAGNOSIS — F4310 Post-traumatic stress disorder, unspecified: Secondary | ICD-10-CM | POA: Diagnosis not present

## 2020-10-07 NOTE — ED Notes (Signed)
Patient Alert and oriented to baseline. Stable and ambulatory to baseline. Patient verbalized understanding of the discharge instructions.  Patient belongings were taken by the patient. Pt stated all of her medication and high value items were secured by family prior to ED visit.

## 2020-10-07 NOTE — Consult Note (Addendum)
Telepsych Consultation   Reason for Consult:  Suicidal Behavior/SuicideAttempt Referring Physician:  EDP (Dr. Alona Bene, MD) Location of Patient: [facility]  Location of Provider: Other: [facility])  Patient Identification: Melissa Burke MRN:  [id number] Principal Diagnosis: Suicidal behavior with attempted self-injury [facility]) Diagnosis:  Principal Problem:   Suicidal behavior with attempted self-injury [facility]) Active Problems:   PTSD (post-traumatic stress disorder)   Seasonal affective disorder (HCC)   Total Time spent with patient: 30 minutes  Subjective:   Melissa Burke is a 69 y.o. female patient who presented to [facility] ED on 10/06/2020 for reported suicide attempt at her home on the evening of 10/05/2020. Patient was medically cleared and requested to be evaluated by psych. Spoke with the patient this morning. Patient states that she is feeling well today. She reports that the only reason that she "tried to cut herself with a knife" on the night of 10/05/2020 was because she had been drinking alcohol and was intoxicated. Patient states that due to this behavior that she displayed while under the influence of alcohol, she is no longer going to drink alcohol anymore. Patient denies SI, HI, or AVH. Patient reports a histroy of PTSD and Seasonal Affective Disorder. Patient states that she lives in [place] about 545-50 minutes away from [place] and. She states she has not seen a therapist for quite some time (last saw a therapist when she was living in [place]), but is agreeable/willing to engage in and get connected with therapeutic/counseling services near her home in [place]. Patient reports she is living in the basement of a private residence in [place]. She reports that the people she lives with are supportive of her. She reports she has 2 daughters that she does not keep in contact with.   Patient was assessed by Dr. Lucianne Muss and myself via Telepsych.    HPI:  Per Chart Review of 10/06/2020 EDP and Jenny Reichmann, [facility] Notes: Patient is a 69 y.o. female with a history of PTSD who presented to [facility] ED on 10/06/2020 for reported suicide attempt trying to cut her throat with a kitchen knife due to increasing depression related to her Seasonal Affective Disorder. Patient states that her SAD has been made worse recently due to having to keep her lights off at home due to increasing electric bill as well as recently getting into an argument with her fiance. She reported becoming drunk on vodka on the evening of 10/06/2020 when she attempted to cut herself. She denies drinking alcohol regularly/frequently. Patient reported her situation to a friend, who stated that she would call 911 or take her to the Emergency Room. Patient states she is not connected with outpatient psychiatry or therapy and has no reported history of previous inpatient psychiatric treatment. Patient denied SI, HI or AVH when assessed by Jenny Reichmann, [facility].  Past Psychiatric History:  -PTSD -Seasonal Affective Disorder (SAD)  Risk to Self:  None Risk to Others:  None Prior Inpatient Therapy:  None Prior Outpatient Therapy:  Patient states she saw a therapist quite some time ago in [place], but is not currently seeing a therapist.   Past Medical History:  Past Medical History:  Diagnosis Date  . Asthma   . Avascular necrosis (HCC)   . Chronic pain syndrome   . Chronic respiratory failure (HCC)   . Compression fracture of T12 vertebra (HCC)   . Osteoarthritis   . Osteopenia   . PTSD (post-traumatic stress disorder)   . Restless leg syndrome   .  Severe anemia   . Sleep apnea     Past Surgical History:  Procedure Laterality Date  . ABDOMINAL HYSTERECTOMY    . BLADDER REPAIR    . BREAST SURGERY    . FRACTURE SURGERY    . IR VERTEBROPLASTY CERV/THOR BX INC UNI/BIL INC/INJECT/IMAGING  03/19/2019   Family History:  Family History  Problem Relation Age of Onset   . Emphysema Mother   . Cancer Sister   . Heart attack Other   . Colon cancer Other    Family Psychiatric  History: None Reported by patient. Social History:  Social History   Substance and Sexual Activity  Alcohol Use Never     Social History   Substance and Sexual Activity  Drug Use Never    Social History   Socioeconomic History  . Marital status: Widowed    Spouse name: Not on file  . Number of children: Not on file  . Years of education: Not on file  . Highest education level: Not on file  Occupational History  . Not on file  Tobacco Use  . Smoking status: Never Smoker  . Smokeless tobacco: Never Used  Vaping Use  . Vaping Use: Never used  Substance and Sexual Activity  . Alcohol use: Never  . Drug use: Never  . Sexual activity: Not on file  Other Topics Concern  . Not on file  Social History Narrative  . Not on file   Social Determinants of Health   Financial Resource Strain: Not on file  Food Insecurity: Not on file  Transportation Needs: Not on file  Physical Activity: Not on file  Stress: Not on file  Social Connections: Not on file   Additional Social History:    Allergies:   Allergies  Allergen Reactions  . Erythromycin Diarrhea  . Other Other (See Comments)    Metal - breakout    Labs:  Results for orders placed or performed during the hospital encounter of 10/06/20 (from the past 48 hour(s))  Comprehensive metabolic panel     Status: Abnormal   Collection Time: 10/06/20  4:11 PM  Result Value Ref Range   Sodium 136 135 - 145 mmol/L   Potassium 4.5 3.5 - 5.1 mmol/L   Chloride 101 98 - 111 mmol/L   CO2 25 22 - 32 mmol/L   Glucose, Bld 118 (H) 70 - 99 mg/dL    Comment: Glucose reference range applies only to samples taken after fasting for at least 8 hours.   BUN 30 (H) 8 - 23 mg/dL   Creatinine, Ser 1.61 0.44 - 1.00 mg/dL   Calcium 9.3 8.9 - 09.6 mg/dL   Total Protein 7.3 6.5 - 8.1 g/dL   Albumin 4.2 3.5 - 5.0 g/dL   AST 29 15 -  41 U/L   ALT 37 0 - 44 U/L   Alkaline Phosphatase 95 38 - 126 U/L   Total Bilirubin 0.8 0.3 - 1.2 mg/dL   GFR, Estimated >04 >54 mL/min    Comment: (NOTE) Calculated using the CKD-EPI Creatinine Equation (2021)    Anion gap 10 5 - 15    Comment: Performed at [facility], [address], [place], [place] [address]  Ethanol     Status: None   Collection Time: 10/06/20  4:11 PM  Result Value Ref Range   Alcohol, Ethyl (B) <10 <10 mg/dL    Comment: (NOTE) Lowest detectable limit for serum alcohol is 10 mg/dL.  For medical purposes only. Performed  at [facility], [address] [place], [place] [address]   Salicylate level     Status: Abnormal   Collection Time: 10/06/20  4:11 PM  Result Value Ref Range   Salicylate Lvl <7.0 (L) 7.0 - 30.0 mg/dL    Comment: Performed at [facility], [address], [place], [place] [address]  Acetaminophen level     Status: Abnormal   Collection Time: 10/06/20  4:11 PM  Result Value Ref Range   Acetaminophen (Tylenol), Serum <10 (L) 10 - 30 ug/mL    Comment: (NOTE) Therapeutic concentrations vary significantly. A range of 10-30 ug/mL  may be an effective concentration for many patients. However, some  are best treated at concentrations outside of this range. Acetaminophen concentrations >150 ug/mL at 4 hours after ingestion  and >50 ug/mL at 12 hours after ingestion are often associated with  toxic reactions.  Performed at [facility], [address], [place], [place] [address]   cbc     Status: Abnormal   Collection Time: 10/06/20  4:11 PM  Result Value Ref Range   WBC 13.7 (H) 4.0 - 10.5 K/uL   RBC 4.34 3.87 - 5.11 MIL/uL   Hemoglobin 13.6 12.0 - 15.0 g/dL   HCT 32.9 51.8 - 84.1 %   MCV 97.7 80.0 - 100.0 fL   MCH 31.3 26.0 - 34.0 pg   MCHC 32.1 30.0 - 36.0 g/dL   RDW 66.0 63.0 - 16.0 %   Platelets 246 150 - 400 K/uL   nRBC 0.0 0.0 - 0.2 %    Comment: Performed at [facility], [address],  [place], [place] [address]  Resp Panel by RT-PCR (Flu A&B, Covid) Nasopharyngeal Swab     Status: None   Collection Time: 10/06/20  7:24 PM   Specimen: Nasopharyngeal Swab; Nasopharyngeal(NP) swabs in vial transport medium  Result Value Ref Range   SARS Coronavirus 2 by RT PCR NEGATIVE NEGATIVE    Comment: (NOTE) SARS-CoV-2 target nucleic acids are NOT DETECTED.  The SARS-CoV-2 RNA is generally detectable in upper respiratory specimens during the acute phase of infection. The lowest concentration of SARS-CoV-2 viral copies this assay can detect is 138 copies/mL. A negative result does not preclude SARS-Cov-2 infection and should not be used as the sole basis for treatment or other patient management decisions. A negative result may occur with  improper specimen collection/handling, submission of specimen other than nasopharyngeal swab, presence of viral mutation(s) within the areas targeted by this assay, and inadequate number of viral copies(<138 copies/mL). A negative result must be combined with clinical observations, patient history, and epidemiological information. The expected result is Negative.  Fact Sheet for Patients:  BloggerCourse.com  Fact Sheet for Healthcare Providers:  SeriousBroker.it  This test is no t yet approved or cleared by the [place] FDA and  has been authorized for detection and/or diagnosis of SARS-CoV-2 by FDA under an Emergency Use Authorization (EUA). This EUA will remain  in effect (meaning this test can be used) for the duration of the COVID-19 declaration under Section 564(b)(1) of the Act, 21 U.S.C.section 360bbb-3(b)(1), unless the authorization is terminated  or revoked sooner.       Influenza A by PCR NEGATIVE NEGATIVE   Influenza B by PCR NEGATIVE NEGATIVE    Comment: (NOTE) The Xpert Xpress SARS-CoV-2/FLU/RSV plus assay is intended as an aid in the diagnosis of influenza from  Nasopharyngeal swab specimens and should not be used as  a sole basis for treatment. Nasal washings and aspirates are unacceptable for Xpert Xpress SARS-CoV-2/FLU/RSV testing.  Fact Sheet for Patients: BloggerCourse.com  Fact Sheet for Healthcare Providers: SeriousBroker.it  This test is not yet approved or cleared by the [place] FDA and has been authorized for detection and/or diagnosis of SARS-CoV-2 by FDA under an Emergency Use Authorization (EUA). This EUA will remain in effect (meaning this test can be used) for the duration of the COVID-19 declaration under Section 564(b)(1) of the Act, 21 U.S.C. section 360bbb-3(b)(1), unless the authorization is terminated or revoked.  Performed at [facility], [address] [place], [place] [address]   Rapid urine drug screen (hospital performed)     Status: Abnormal   Collection Time: 10/06/20  8:26 PM  Result Value Ref Range   Opiates POSITIVE (A) NONE DETECTED   Cocaine NONE DETECTED NONE DETECTED   Benzodiazepines NONE DETECTED NONE DETECTED   Amphetamines NONE DETECTED NONE DETECTED   Tetrahydrocannabinol NONE DETECTED NONE DETECTED   Barbiturates NONE DETECTED NONE DETECTED    Comment: (NOTE) DRUG SCREEN FOR MEDICAL PURPOSES ONLY.  IF CONFIRMATION IS NEEDED FOR ANY PURPOSE, NOTIFY LAB WITHIN 5 DAYS.  LOWEST DETECTABLE LIMITS FOR URINE DRUG SCREEN Drug Class                     Cutoff (ng/mL) Amphetamine and metabolites    1000 Barbiturate and metabolites    200 Benzodiazepine                 200 Tricyclics and metabolites     300 Opiates and metabolites        300 Cocaine and metabolites        300 THC                            50 Performed at [facility], [address] [place], [place] [address]     Medications:  Current Facility-Administered Medications  Medication Dose Route Frequency Provider Last Rate Last Admin  . alum & mag  hydroxide-simeth (MAALOX/MYLANTA) 200-200-20 MG/5ML suspension 30 mL  30 mL Oral Q6H PRN Long, Arlyss Repress, MD      . amLODipine (NORVASC) tablet 10 mg  10 mg Oral q AM Long, Arlyss Repress, MD   10 mg at 10/07/20 0925  . budesonide (PULMICORT) nebulizer solution 0.25 mg  0.25 mg Nebulization BID Long, Arlyss Repress, MD   0.25 mg at 10/07/20 6759  . celecoxib (CELEBREX) capsule 200 mg  200 mg Oral QHS Long, Arlyss Repress, MD   200 mg at 10/06/20 2248  . DULoxetine (CYMBALTA) DR capsule 60 mg  60 mg Oral BID Long, Arlyss Repress, MD   60 mg at 10/07/20 1638  . HYDROcodone-acetaminophen (NORCO) 10-325 MG per tablet 1 tablet  1 tablet Oral QID PRN Henderly, Britni A, PA-C   1 tablet at 10/07/20 0700  . Ipratropium-Albuterol (COMBIVENT) respimat 1 puff  1 puff Inhalation Q6H PRN Long, Arlyss Repress, MD      . levothyroxine (SYNTHROID) tablet 112 mcg  112 mcg Oral q AM Long, Arlyss Repress, MD   112 mcg at 10/07/20 [phone]  . loratadine (CLARITIN) tablet 10 mg  10 mg Oral Daily Long, Arlyss Repress, MD   10 mg at 10/07/20 9935  . losartan (COZAAR) tablet 25 mg  25 mg Oral q AM Long, Arlyss Repress, MD   25 mg at 10/07/20 0925  .  metFORMIN (GLUCOPHAGE) tablet 500 mg  500 mg Oral BID WC Long, Arlyss Repress, MD   500 mg at 10/07/20 4540  . methocarbamol (ROBAXIN) tablet 750 mg  750 mg Oral TID PRN Henderly, Britni A, PA-C   750 mg at 10/07/20 1036  . montelukast (SINGULAIR) tablet 10 mg  10 mg Oral QHS Long, Arlyss Repress, MD   10 mg at 10/06/20 2248  . nortriptyline (PAMELOR) capsule 75 mg  75 mg Oral QHS Long, Arlyss Repress, MD   75 mg at 10/06/20 2247  . ondansetron (ZOFRAN) tablet 4 mg  4 mg Oral Q8H PRN Long, Arlyss Repress, MD      . pantoprazole (PROTONIX) EC tablet 40 mg  40 mg Oral BID Long, Arlyss Repress, MD   40 mg at 10/07/20 9811  . rOPINIRole (REQUIP) tablet 4 mg  4 mg Oral QHS Long, Arlyss Repress, MD   4 mg at 10/06/20 2247  . traZODone (DESYREL) tablet 300 mg  300 mg Oral QHS Long, Arlyss Repress, MD   300 mg at 10/06/20 2248   Current Outpatient Medications  Medication Sig  Dispense Refill  . alendronate (FOSAMAX) 70 MG tablet Take 70 mg by mouth every Monday. Take with a full glass of water on an empty stomach in the morning    . amLODipine (NORVASC) 10 MG tablet Take 10 mg by mouth in the morning.     Marland Kitchen ammonium lactate (AMLACTIN) 12 % cream Apply topically daily as needed for dry skin.    . budesonide (PULMICORT) 0.25 MG/2ML nebulizer solution Take 2 mLs (0.25 mg total) by nebulization 2 (two) times daily. 60 mL 12  . calcium carbonate (OSCAL) 1500 (600 Ca) MG TABS tablet Take 600 mg of elemental calcium by mouth 2 (two) times daily with a meal.    . celecoxib (CELEBREX) 200 MG capsule Take 200 mg by mouth at bedtime.     . Cholecalciferol (VITAMIN D3) 125 MCG (5000 UT) CAPS Take 1 capsule by mouth in the morning.    . DULoxetine (CYMBALTA) 60 MG capsule Take 60 mg by mouth 2 (two) times daily.     Marland Kitchen HYDROcodone-acetaminophen (NORCO) 10-325 MG tablet Take 1 tablet by mouth 4 (four) times daily as needed for moderate pain. (Patient taking differently: Take 1 tablet by mouth every 6 (six) hours as needed for moderate pain.) 10 tablet 0  . Ipratropium-Albuterol (COMBIVENT) 20-100 MCG/ACT AERS respimat Take 1 puff by mouth every 6 (six) hours as needed for wheezing or shortness of breath.     . levothyroxine (SYNTHROID) 112 MCG tablet Take 112 mcg by mouth in the morning.     . loratadine (CLARITIN) 10 MG tablet Take 10 mg by mouth in the morning and at bedtime.    Marland Kitchen losartan (COZAAR) 25 MG tablet Take 25 mg by mouth in the morning.     . methocarbamol (ROBAXIN) 750 MG tablet Take 750 mg by mouth 3 (three) times daily as needed for muscle spasms.    . montelukast (SINGULAIR) 10 MG tablet Take 10 mg by mouth at bedtime.     . Multiple Vitamins-Minerals (MULTIVITAMIN ADULT) TABS Take 1 tablet by mouth in the morning.     . nortriptyline (PAMELOR) 25 MG capsule Take 75 mg by mouth at bedtime.    . pantoprazole (PROTONIX) 40 MG tablet Take 40 mg by mouth 2 (two) times  daily.     Marland Kitchen rOPINIRole (REQUIP) 4 MG tablet Take 4 mg by mouth at bedtime.     Marland Kitchen  traZODone (DESYREL) 100 MG tablet Take 300 mg by mouth at bedtime.     . vitamin B-12 (CYANOCOBALAMIN) 1000 MCG tablet Take 1,000 mcg by mouth in the morning.     . metFORMIN (GLUCOPHAGE) 500 MG tablet Take 1 tablet (500 mg total) by mouth 2 (two) times daily with a meal. (Patient not taking: No sig reported) 60 tablet 11  . predniSONE (DELTASONE) 10 MG tablet Take 3 tablets (30 mg total) by mouth daily. 30 mg daily x 3 days then 20 mg daily x 3 days then 10 mg daily x 3 days then STOP (Patient not taking: No sig reported) 30 tablet 0    Musculoskeletal: Strength & Muscle Tone: Unable to assess- patient sitting in ED bed  Gait & Station: Unable to assess- patient sitting in ED bed Patient leans: N/A  Psychiatric Specialty Exam: Physical Exam  Review of Systems  Blood pressure (!) 151/82, pulse 95, temperature 98.2 F (36.8 C), temperature source Oral, resp. rate 20, height 5\' 5"  (1.651 m), weight 88 kg, SpO2 99 %.Body mass index is 32.28 kg/m.  General Appearance: Fairly Groomed and wearing hospital scrubs  Eye Contact:  Good  Speech:  Clear and Coherent and Normal Rate  Volume:  Normal  Mood:  Euthymic  Affect:  Congruent  Thought Process:  Coherent, Goal Directed, Linear and Descriptions of Associations: Intact  Orientation:  Full (Time, Place, and Person)  Thought Content:  WDL  Suicidal Thoughts:  No  Homicidal Thoughts:  No  Memory:  Immediate;   Good Recent;   Good Remote;   Good  Judgement:  Fair  Insight:  Fair  Psychomotor Activity:  Normal  Concentration:  Concentration: Good and Attention Span: Good  Recall:  Good  Fund of Knowledge:  Good  Language:  Good  Akathisia:  No  Handed:  Right  AIMS (if indicated):     Assets:  Communication Skills Desire for Improvement Financial Resources/Insurance Housing Leisure Time Physical Health Resilience Social Support Transportation   ADL's:  Intact  Cognition:  WNL  Sleep:      Demographic Factors:  Age 32 or older, Caucasian and Low socioeconomic status  Loss Factors: Loss of significant relationship and Financial problems/change in socioeconomic status  Historical Factors: Impulsivity  Risk Reduction Factors:   Living with another person, especially a relative, Positive social support and Positive therapeutic relationship  Continued Clinical Symptoms:  Alcohol/Substance Abuse/Dependencies More than one psychiatric diagnosis Previous Psychiatric Diagnoses and Treatments  Cognitive Features That Contribute To Risk:  None    Suicide Risk:  Minimal: No identifiable suicidal ideation.  Patients presenting with no risk factors but with morbid ruminations; may be classified as minimal risk based on the severity of the depressive symptoms   Disposition: No evidence of imminent risk to self or others at present.   Patient does not meet criteria for psychiatric inpatient admission. Supportive therapy provided about ongoing stressors. Discussed crisis plan, support from social network, calling 911, coming to the Emergency Department, and calling Suicide Hotline.  Patient is psych cleared. Spoke to Dr. 76 regarding patient's disposition.   Tilden Fossa, LCAS to inquire about connecting patient wit outpatient resources near her home in [place].  This service was provided via telemedicine using a 2-way, interactive audio and video technology.  Names of all persons participating in this telemedicine service and their role in this encounter. Name: [place] Role: PA-C  Name: Dr. Melbourne Abts Role: MD  Name: Nelly Rout Role: LCAS  Name: Hedda SladeLillian Gianino Role: Patient    Steve RattlerCody W Desirey Keahey, PA-C 10/07/2020 10:48 AM

## 2020-10-07 NOTE — Progress Notes (Signed)
Pt meets criteria for inpatient treatment per Nira Conn, FNP. Referred out to the following geriatric psych hospitals:   [facility]  [facility]  [facility]  [facility]  [facility]  [facility]  [facility]  [facility]  [facility]     Disposition CSW will continue to follow for placement    Wells Guiles, MSW, LCSW, LCAS Clinical Social Worker II Disposition CSW [phone]

## 2020-10-07 NOTE — ED Provider Notes (Signed)
Patient has been psychiatrically cleared for discharge home with outpatient resources. Patient feels she can be safe in a home setting.   Tilden Fossa, MD 10/07/20 1134

## 2020-10-07 NOTE — BH Assessment (Signed)
Per Lucianne Muss MD patient does not meet inpatient criteria and will be discharged later this date. This Clinical research associate provided patient with resources in her area to assist with counseling and crisis numbers to contact if patient has ongoing mental health issues associated with thoughts of self harm. Patient contracts for safety and is requesting to be discharged.

## 2020-12-15 IMAGING — CT CT THORACIC SPINE WITHOUT CONTRAST
3 of 4 series · 10 of 33 positions shown, 12 images · non-contrast
Comparison: 02/19/2019

CLINICAL DATA: Lower thoracic wedge compression fracture. Spinal
stimulator.

EXAM:
CT THORACIC SPINE WITHOUT CONTRAST
TECHNIQUE: Multidetector CT images of the thoracic were obtained using the
standard protocol without intravenous contrast.

[Series 3: t-spine 2.00 br40 s3 ax · axial · 0.34mm/px · z∈[-957,-845]mm · 2 of 170 slices shown, 3 images]
[im 57/170  soft-tissue]
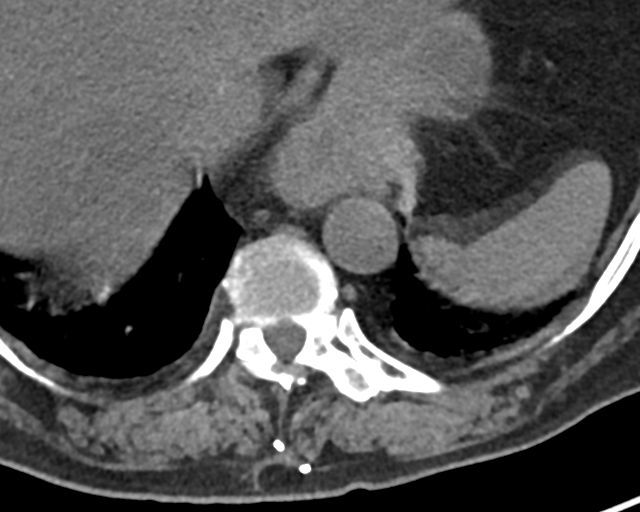
[im 57/170  bone]
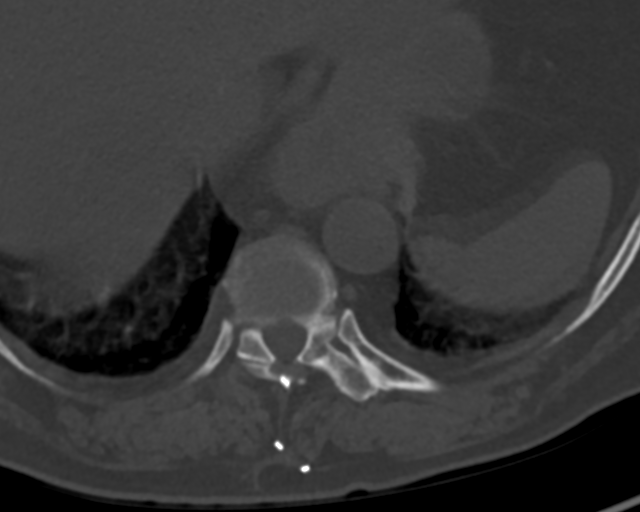
[im 113/170  bone]
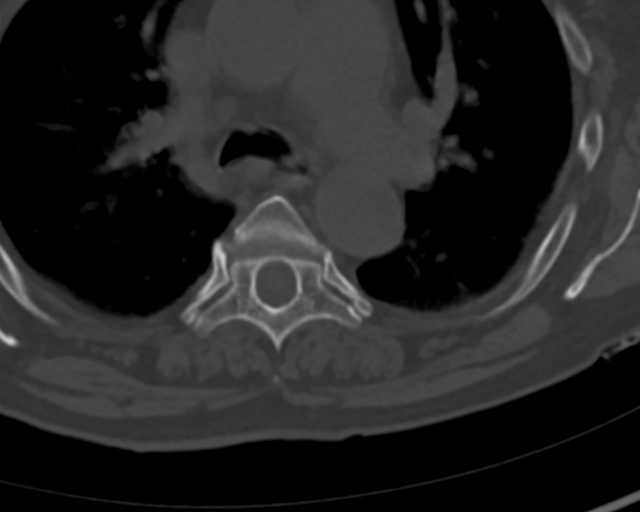

[Series 7: t-spine 2.00 br60 s3 sag · sagittal · 0.37mm/px · 5 of 107 slices shown, 6 images]
[im 36/107  bone]
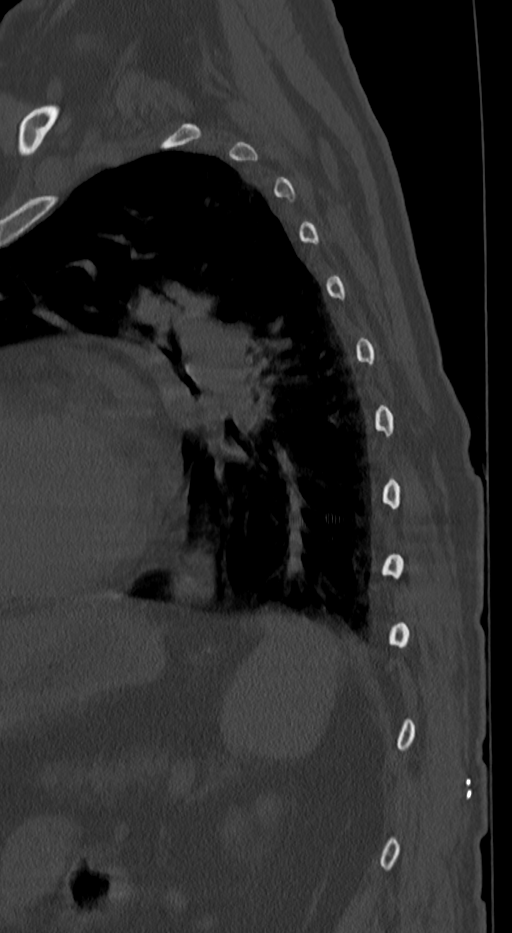
[im 45/107  bone]
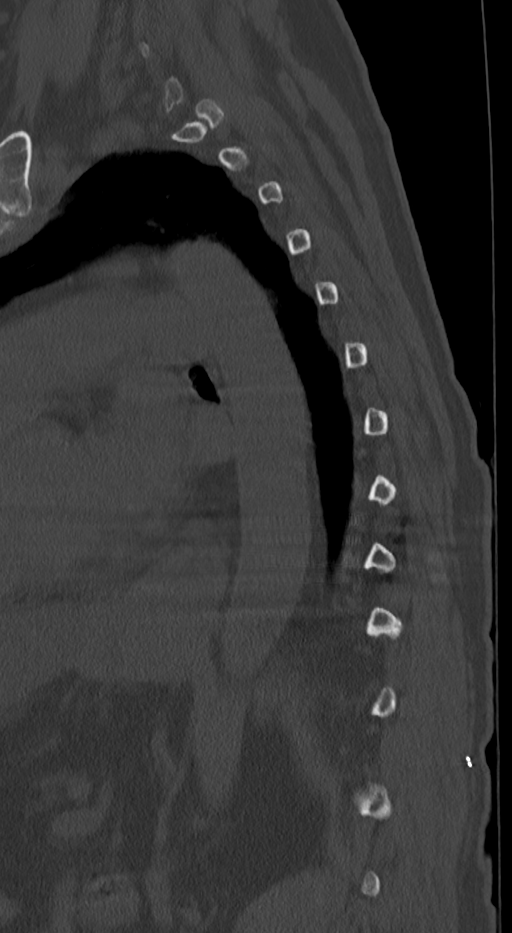
[im 54/107  soft-tissue]
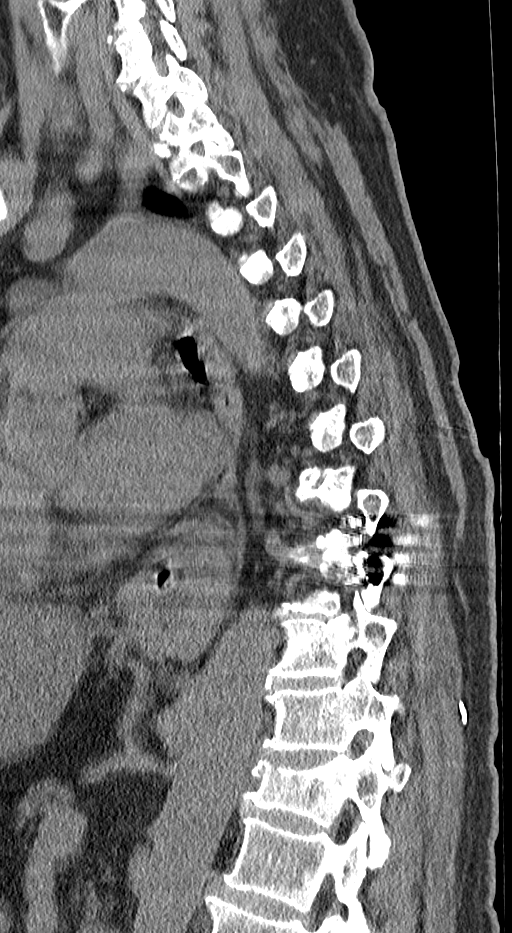
[im 54/107  bone]
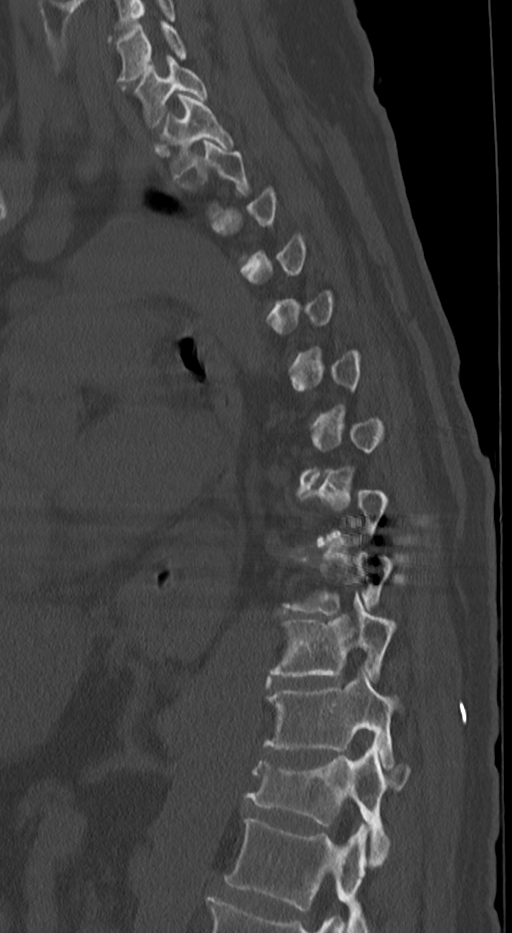
[im 62/107  bone]
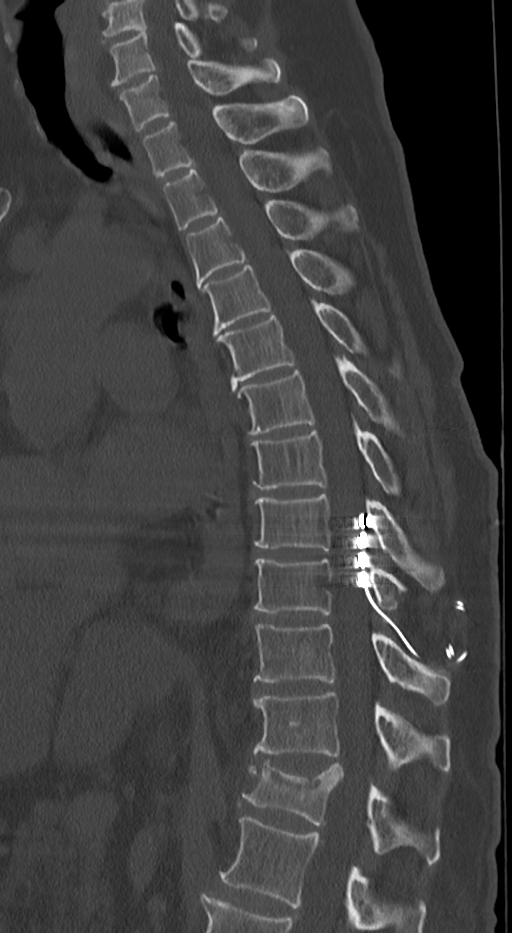
[im 71/107  bone]
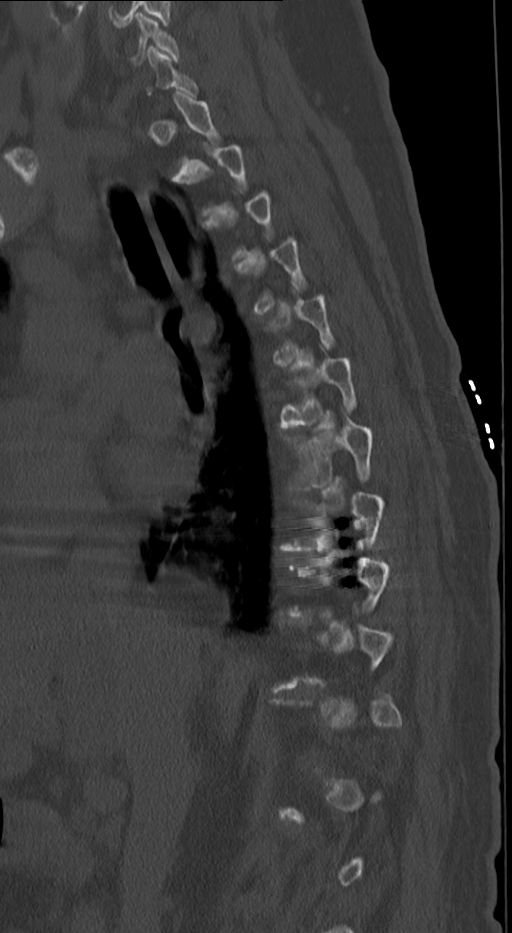

[Series 9: t-spine 2.00 br60 s3 cor · coronal · 0.40mm/px · 3 of 93 slices shown]
[im 19/93  bone]
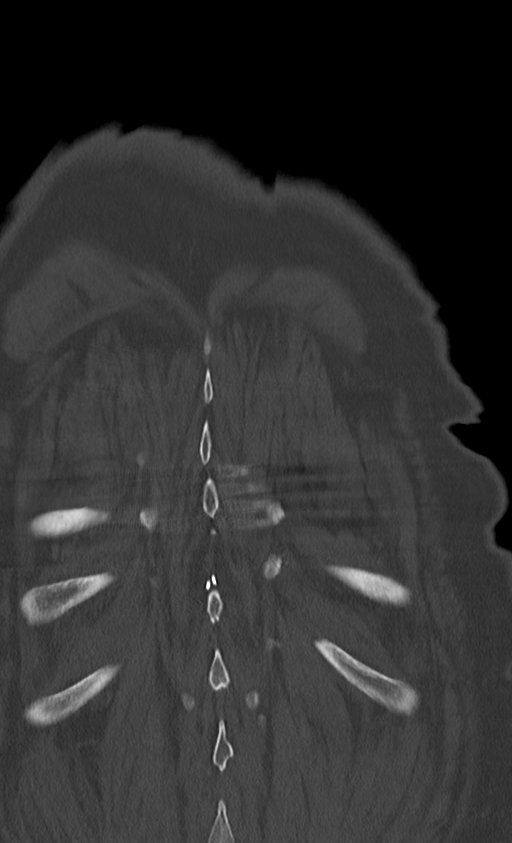
[im 37/93  bone]
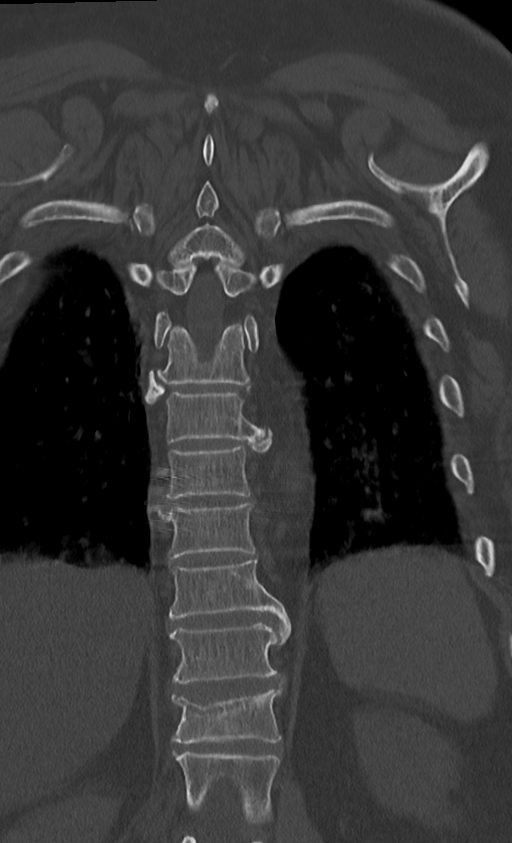
[im 56/93  bone]
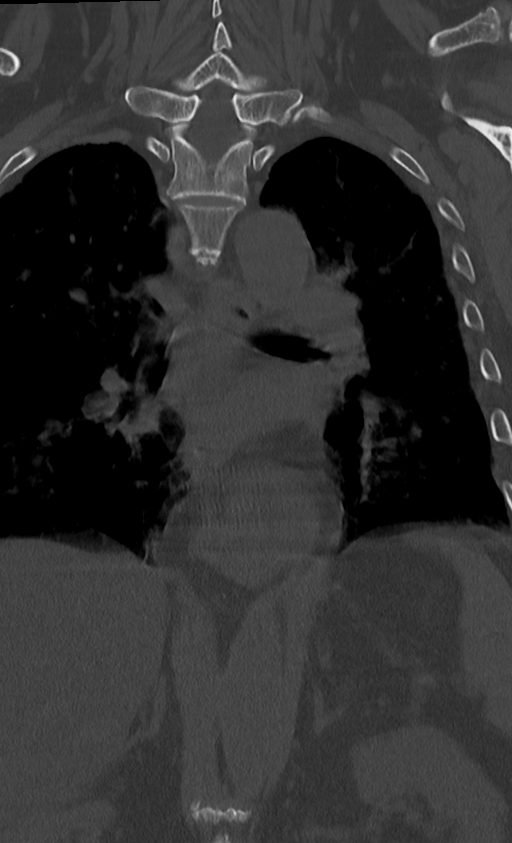

[10 of 33 positions shown; findings below may reference images not displayed]

FINDINGS: Alignment: No vertebral subluxation is observed.

Vertebrae: 40% superior endplate compression fracture at T12 with
0.5 cm of posterior bony retropulsion on image 48/7, and fracture
plane extending into the posterior margin of the vertebral body. No
fracture of the pedicle or lamina. No other thoracic spine
fractures.

A dorsal column stimulator is present with electrodes posteriorly
and slightly eccentric to the right at the T8-T9 level.

Paraspinal and other soft tissues: Mild atherosclerotic
calcification of the aortic arch. Small to moderate-sized type 3
hiatal hernia. Hepatic steatosis of the visualized part of the
liver. 2 mm left kidney upper pole nonobstructive renal calculus.

Disc levels: No significant impingement identified above T11-12.

T11-12: The posterior bony retropulsion from the superior endplate
of T12 causes borderline central narrowing of the thecal sac.

T12-L1: Central disc protrusion at this level causes borderline
central narrowing of the thecal sac.
IMPRESSION: 1. 40% superior endplate compression fracture of T12 with 5 mm of
posterior bony retropulsion contributing to borderline central
narrowing of the thecal sac at the T11-12 level.
2. There is also borderline central narrowing of the thecal sac at
T12-L1 due to a disc protrusion.
3. Dorsal column stimulator noted with electrodes posteriorly at the
T8-9 level, minimally eccentric to the right.
4.  Aortic Atherosclerosis (6751L-[id number].R).
5. Small to moderate-sized type 3 hiatal hernia.
6. Hepatic steatosis.
7. 2 mm left kidney upper pole nonobstructive renal calculus.

## 2021-02-14 ENCOUNTER — Emergency Department (HOSPITAL_COMMUNITY): Payer: Medicare Other

## 2021-02-14 ENCOUNTER — Emergency Department (HOSPITAL_COMMUNITY)
Admission: EM | Admit: 2021-02-14 | Discharge: 2021-02-14 | Disposition: A | Discharge status: Home / Self Care | Payer: Medicare Other | Attending: Emergency Medicine | Admitting: Emergency Medicine

## 2021-02-14 ENCOUNTER — Other Ambulatory Visit: Payer: Self-pay

## 2021-02-14 ENCOUNTER — Encounter (HOSPITAL_COMMUNITY): Payer: Self-pay | Admitting: Emergency Medicine

## 2021-02-14 DIAGNOSIS — J441 Chronic obstructive pulmonary disease with (acute) exacerbation: Secondary | ICD-10-CM | POA: Insufficient documentation

## 2021-02-14 DIAGNOSIS — Z20822 Contact with and (suspected) exposure to covid-19: Secondary | ICD-10-CM | POA: Diagnosis not present

## 2021-02-14 DIAGNOSIS — J45909 Unspecified asthma, uncomplicated: Secondary | ICD-10-CM | POA: Diagnosis not present

## 2021-02-14 DIAGNOSIS — Z7951 Long term (current) use of inhaled steroids: Secondary | ICD-10-CM | POA: Insufficient documentation

## 2021-02-14 DIAGNOSIS — R059 Cough, unspecified: Secondary | ICD-10-CM | POA: Insufficient documentation

## 2021-02-14 LAB — COMPREHENSIVE METABOLIC PANEL
ALT: 48 U/L — ABNORMAL HIGH (ref 0–44)
AST: 50 U/L — ABNORMAL HIGH (ref 15–41)
Albumin: 4.1 g/dL (ref 3.5–5.0)
Alkaline Phosphatase: 74 U/L (ref 38–126)
Anion gap: 9 (ref 5–15)
BUN: 7 mg/dL — ABNORMAL LOW (ref 8–23)
CO2: 28 mmol/L (ref 22–32)
Calcium: 9.3 mg/dL (ref 8.9–10.3)
Chloride: 100 mmol/L (ref 98–111)
Creatinine, Ser: 0.86 mg/dL (ref 0.44–1.00)
GFR, Estimated: >60 mL/min (ref >60)
Glucose, Bld: 117 mg/dL — ABNORMAL HIGH (ref 70–99)
Potassium: 3.9 mmol/L (ref 3.5–5.1)
Sodium: 137 mmol/L (ref 135–145)
Total Bilirubin: 0.5 mg/dL (ref 0.3–1.2)
Total Protein: 7.4 g/dL (ref 6.5–8.1)

## 2021-02-14 LAB — RESP PANEL BY RT-PCR (FLU A&B, COVID) ARPGX2
Influenza A by PCR: NEGATIVE
Influenza B by PCR: NEGATIVE
SARS Coronavirus 2 by RT PCR: NEGATIVE

## 2021-02-14 LAB — CBC WITH DIFFERENTIAL/PLATELET
Abs Immature Granulocytes: 0.02 10*3/uL (ref 0.00–0.07)
Basophils Absolute: 0.1 10*3/uL (ref 0.0–0.1)
Basophils Relative: 1 %
Eosinophils Absolute: 0.4 10*3/uL (ref 0.0–0.5)
Eosinophils Relative: 7 %
HCT: 45.2 % (ref 36.0–46.0)
Hemoglobin: 14.1 g/dL (ref 12.0–15.0)
Immature Granulocytes: 0 %
Lymphocytes Relative: 12 %
Lymphs Abs: 0.7 10*3/uL (ref 0.7–4.0)
MCH: 31.3 pg (ref 26.0–34.0)
MCHC: 31.2 g/dL (ref 30.0–36.0)
MCV: 100.2 fL — ABNORMAL HIGH (ref 80.0–100.0)
Monocytes Absolute: 1.1 10*3/uL — ABNORMAL HIGH (ref 0.1–1.0)
Monocytes Relative: 21 %
Neutro Abs: 3.1 10*3/uL (ref 1.7–7.7)
Neutrophils Relative %: 59 %
Platelets: 153 10*3/uL (ref 150–400)
RBC: 4.51 MIL/uL (ref 3.87–5.11)
RDW: 14.7 % (ref 11.5–15.5)
WBC: 5.3 10*3/uL (ref 4.0–10.5)
nRBC: 0 % (ref 0.0–0.2)

## 2021-02-14 MED ORDER — DOXYCYCLINE HYCLATE 100 MG PO CAPS: 100.0000 mg | ORAL_CAPSULE | Freq: Two times a day (BID) | ORAL | 0 refills | Status: DC

## 2021-02-14 MED ORDER — ALBUTEROL SULFATE HFA 108 (90 BASE) MCG/ACT IN AERS
2.0000 | INHALATION_SPRAY | Freq: Once | RESPIRATORY_TRACT | Status: AC
  Administered 2021-02-14: 2 via RESPIRATORY_TRACT
  Filled 2021-02-14: qty 6.7

## 2021-02-14 MED ORDER — PREDNISONE 20 MG PO TABS: ORAL_TABLET | ORAL | 0 refills | Status: DC

## 2021-02-14 NOTE — Discharge Instructions (Signed)
Follow-up with your family doctor next week or sooner if not improving

## 2021-02-14 NOTE — ED Triage Notes (Signed)
Pt c/o cough, fever and congestion since Wednesday. Pt on oxygen as needed (4L). Pt taken azithromycin prescribed by pcp and combivent inhaler at home.

## 2021-02-14 NOTE — ED Provider Notes (Signed)
[facility] EMERGENCY DEPARTMENT Provider Note   CSN: [id number] Arrival date & time: 02/14/21  0854     History Chief Complaint  Patient presents with  . Cough    Melissa Burke is a 70 y.o. female.  Patient complains of cough and mild fever she has a history of COPD  The history is provided by the patient and medical records. No language interpreter was used.  Cough Cough characteristics:  Non-productive Sputum characteristics:  Unable to specify Severity:  Moderate Onset quality:  Sudden Timing:  Constant Progression:  Worsening Chronicity:  New Smoker: no   Relieved by:  Nothing Worsened by:  Nothing Associated symptoms: no chest pain, no eye discharge, no headaches and no rash        Past Medical History:  Diagnosis Date  . Asthma   . Avascular necrosis (HCC)   . Chronic pain syndrome   . Chronic respiratory failure (HCC)   . Compression fracture of T12 vertebra (HCC)   . Osteoarthritis   . Osteopenia   . PTSD (post-traumatic stress disorder)   . Restless leg syndrome   . Severe anemia   . Sleep apnea     Patient Active Problem List   Diagnosis Date Noted  . Suicidal behavior with attempted self-injury (HCC) 10/07/2020  . Seasonal affective disorder (HCC) 10/07/2020  . COPD exacerbation (HCC) 06/15/2020  . Pneumonia 06/13/2020  . Avascular necrosis (HCC) 01/11/2017  . Breast cyst, left 01/11/2017  . Breast implant rupture 01/11/2017  . Broken foot, left, sequela 01/11/2017  . Chronic TMJ pain 01/11/2017  . PTSD (post-traumatic stress disorder) 01/11/2017  . Spinal cord stimulator status 01/11/2017  . Osteoporosis 01/11/2017    Past Surgical History:  Procedure Laterality Date  . ABDOMINAL HYSTERECTOMY    . BLADDER REPAIR    . BREAST SURGERY    . FRACTURE SURGERY    . IR VERTEBROPLASTY CERV/THOR BX INC UNI/BIL INC/INJECT/IMAGING  03/19/2019     OB History    Gravida  3   Para  2   Term  2   Preterm      AB  1   Living         SAB  1   IAB      Ectopic      Multiple      Live Births              Family History  Problem Relation Age of Onset  . Emphysema Mother   . Cancer Sister   . Heart attack Other   . Colon cancer Other     Social History   Tobacco Use  . Smoking status: Never Smoker  . Smokeless tobacco: Never Used  Vaping Use  . Vaping Use: Never used  Substance Use Topics  . Alcohol use: Never  . Drug use: Never    Home Medications Prior to Admission medications   Medication Sig Start Date End Date Taking? Authorizing Provider  alendronate (FOSAMAX) 70 MG tablet Take 70 mg by mouth every Monday. Take with a full glass of water on an empty stomach in the morning   Yes [provider]  amLODipine (NORVASC) 10 MG tablet Take 10 mg by mouth in the morning.  10/27/18  Yes [provider]  ammonium lactate (AMLACTIN) 12 % cream Apply topically daily as needed for dry skin. 06/13/20  Yes [provider]  budesonide (PULMICORT) 0.25 MG/2ML nebulizer solution Take 2 mLs (0.25 mg total) by nebulization 2 (two)  times daily. 06/19/20  Yes Gherghe, Daylene Katayama, MD  calcium carbonate (OSCAL) 1500 (600 Ca) MG TABS tablet Take 600 mg of elemental calcium by mouth 2 (two) times daily with a meal.   Yes [provider]  celecoxib (CELEBREX) 200 MG capsule Take 200 mg by mouth at bedtime.  11/07/16  Yes [provider]  Cholecalciferol (VITAMIN D3) 125 MCG (5000 UT) CAPS Take 1 capsule by mouth in the morning.   Yes [provider]  DULoxetine (CYMBALTA) 60 MG capsule Take 60 mg by mouth 2 (two) times daily.  12/20/16  Yes [provider]  HYDROcodone-acetaminophen (NORCO) 10-325 MG tablet Take 1 tablet by mouth 4 (four) times daily as needed for moderate pain. Patient taking differently: Take 1 tablet by mouth every 6 (six) hours as needed for moderate pain. 06/19/20  Yes Gherghe, Daylene Katayama, MD  Ipratropium-Albuterol (COMBIVENT) 20-100 MCG/ACT AERS  respimat Take 1 puff by mouth every 6 (six) hours as needed for wheezing or shortness of breath.  12/05/16  Yes [provider]  levothyroxine (SYNTHROID) 112 MCG tablet Take 112 mcg by mouth in the morning.  12/21/16  Yes [provider]  loratadine (CLARITIN) 10 MG tablet Take 10 mg by mouth in the morning and at bedtime.   Yes [provider]  losartan (COZAAR) 25 MG tablet Take 25 mg by mouth in the morning.  11/03/18  Yes [provider]  methocarbamol (ROBAXIN) 750 MG tablet Take 750 mg by mouth 3 (three) times daily as needed for muscle spasms.   Yes [provider]  montelukast (SINGULAIR) 10 MG tablet Take 10 mg by mouth at bedtime.  12/21/16  Yes [provider]  Multiple Vitamins-Minerals (MULTIVITAMIN ADULT) TABS Take 1 tablet by mouth in the morning.    Yes [provider]  nortriptyline (PAMELOR) 25 MG capsule Take 75 mg by mouth at bedtime.   Yes [provider]  pantoprazole (PROTONIX) 40 MG tablet Take 40 mg by mouth 2 (two) times daily.  05/23/20  Yes [provider]  rOPINIRole (REQUIP) 4 MG tablet Take 4 mg by mouth at bedtime.  12/20/16  Yes [provider]  traZODone (DESYREL) 100 MG tablet Take 300 mg by mouth at bedtime.  12/20/16  Yes [provider]  vitamin B-12 (CYANOCOBALAMIN) 1000 MCG tablet Take 1,000 mcg by mouth in the morning.    Yes [provider]  doxycycline (VIBRAMYCIN) 100 MG capsule Take 1 capsule (100 mg total) by mouth 2 (two) times daily. One po bid x 7 days 02/14/21   Bethann Berkshire, MD  metFORMIN (GLUCOPHAGE) 500 MG tablet Take 1 tablet (500 mg total) by mouth 2 (two) times daily with a meal. Patient not taking: No sig reported 06/19/20 06/19/21  Leatha Gilding, MD  predniSONE (DELTASONE) 20 MG tablet 2 tabs po daily x 3 days 02/14/21   Bethann Berkshire, MD    Allergies    Erythromycin and Other  Review of Systems   Review of Systems  Constitutional: Negative  for appetite change and fatigue.  HENT: Negative for congestion, ear discharge and sinus pressure.   Eyes: Negative for discharge.  Respiratory: Positive for cough.   Cardiovascular: Negative for chest pain.  Gastrointestinal: Negative for abdominal pain and diarrhea.  Genitourinary: Negative for frequency and hematuria.  Musculoskeletal: Negative for back pain.  Skin: Negative for rash.  Neurological: Negative for seizures and headaches.  Psychiatric/Behavioral: Negative for hallucinations.    Physical Exam Updated Vital  Signs BP 118/66   Pulse 92   Temp 98.7 F (37.1 C) (Oral)   Resp 19   Ht 5\' 5"  (1.651 m)   Wt 88 kg   SpO2 98%   BMI 32.28 kg/m   Physical Exam Vitals and nursing note reviewed.  Constitutional:      Appearance: She is well-developed.  HENT:     Head: Normocephalic.     Nose: Nose normal.  Eyes:     General: No scleral icterus.    Conjunctiva/sclera: Conjunctivae normal.  Neck:     Thyroid: No thyromegaly.  Cardiovascular:     Rate and Rhythm: Normal rate and regular rhythm.     Heart sounds: No murmur heard. No friction rub. No gallop.   Pulmonary:     Breath sounds: No stridor. No wheezing or rales.  Chest:     Chest wall: No tenderness.  Abdominal:     General: There is no distension.     Tenderness: There is no abdominal tenderness. There is no rebound.  Musculoskeletal:        General: Normal range of motion.     Cervical back: Neck supple.  Lymphadenopathy:     Cervical: No cervical adenopathy.  Skin:    Findings: No erythema or rash.  Neurological:     Mental Status: She is alert and oriented to person, place, and time.     Motor: No abnormal muscle tone.     Coordination: Coordination normal.  Psychiatric:        Behavior: Behavior normal.     ED Results / Procedures / Treatments   Labs (all labs ordered are listed, but only abnormal results are displayed) Labs Reviewed  CBC WITH DIFFERENTIAL/PLATELET - Abnormal; Notable  for the following components:      Result Value   MCV 100.2 (*)    Monocytes Absolute 1.1 (*)    All other components within normal limits  COMPREHENSIVE METABOLIC PANEL - Abnormal; Notable for the following components:   Glucose, Bld 117 (*)    BUN 7 (*)    AST 50 (*)    ALT 48 (*)    All other components within normal limits  RESP PANEL BY RT-PCR (FLU A&B, COVID) [id number]    EKG None  Radiology DG Chest Port 1 View  Result Date: 02/14/2021 CLINICAL DATA:  Cough, fever EXAM: PORTABLE CHEST 1 VIEW COMPARISON:  06/13/2020 FINDINGS: Stable cardiomegaly. Moderate hiatal hernia. Mild streaky bibasilar opacities. No focal airspace consolidation. No significant pleural fluid collection. No pneumothorax. Thoracic spinal stimulator leads noted. IMPRESSION: 1. Mild streaky bibasilar opacities, favor atelectasis. 2. Moderate hiatal hernia. Electronically Signed   By: Duanne GuessNicholas  Plundo D.O.   On: 02/14/2021 10:09    Procedures Procedures   Medications Ordered in ED Medications  albuterol (VENTOLIN HFA) 108 (90 Base) MCG/ACT inhaler 2 puff (2 puffs Inhalation Given 02/14/21 0957)    ED Course  I have reviewed the triage vital signs and the nursing notes.  Pertinent labs & imaging results that were available during my care of the patient were reviewed by me and considered in my medical decision making (see chart for details).    MDM Rules/Calculators/A&P                         Patient with bronchitis and bronchospasm.  She will be placed on doxycycline and prednisone and follow-up with her PCP Final Clinical Impression(s) / ED Diagnoses Final diagnoses:  Cough  Rx / DC Orders ED Discharge Orders         Ordered    predniSONE (DELTASONE) 20 MG tablet  Status:  Discontinued        02/14/21 1155    doxycycline (VIBRAMYCIN) 100 MG capsule  2 times daily,   Status:  Discontinued        02/14/21 1155    predniSONE (DELTASONE) 20 MG tablet        02/14/21 1155    doxycycline  (VIBRAMYCIN) 100 MG capsule  2 times daily        02/14/21 1155           Bethann Berkshire, MD 02/14/21 1159

## 2021-02-14 NOTE — ED Notes (Signed)
MD at bedside. 

## 2023-02-06 ENCOUNTER — Emergency Department (HOSPITAL_COMMUNITY)
Admission: EM | Admit: 2023-02-06 | Discharge: 2023-02-06 | Disposition: A | Discharge status: Home / Self Care | Payer: Medicare Other | Attending: Emergency Medicine | Admitting: Emergency Medicine

## 2023-02-06 ENCOUNTER — Emergency Department (HOSPITAL_COMMUNITY): Payer: Medicare Other

## 2023-02-06 ENCOUNTER — Other Ambulatory Visit: Payer: Self-pay

## 2023-02-06 ENCOUNTER — Encounter (HOSPITAL_COMMUNITY): Payer: Self-pay | Admitting: *Deleted

## 2023-02-06 DIAGNOSIS — D72829 Elevated white blood cell count, unspecified: Secondary | ICD-10-CM | POA: Insufficient documentation

## 2023-02-06 DIAGNOSIS — W19XXXA Unspecified fall, initial encounter: Secondary | ICD-10-CM

## 2023-02-06 DIAGNOSIS — S0990XA Unspecified injury of head, initial encounter: Secondary | ICD-10-CM | POA: Insufficient documentation

## 2023-02-06 DIAGNOSIS — R1031 Right lower quadrant pain: Secondary | ICD-10-CM | POA: Diagnosis present

## 2023-02-06 DIAGNOSIS — J45909 Unspecified asthma, uncomplicated: Secondary | ICD-10-CM | POA: Diagnosis not present

## 2023-02-06 DIAGNOSIS — N3000 Acute cystitis without hematuria: Secondary | ICD-10-CM | POA: Diagnosis not present

## 2023-02-06 DIAGNOSIS — R109 Unspecified abdominal pain: Secondary | ICD-10-CM

## 2023-02-06 LAB — URINALYSIS, ROUTINE W REFLEX MICROSCOPIC
Bilirubin Urine: NEGATIVE
Glucose, UA: NEGATIVE mg/dL
Hgb urine dipstick: NEGATIVE
Ketones, ur: NEGATIVE mg/dL
Nitrite: NEGATIVE
Protein, ur: 30 mg/dL — AB
Specific Gravity, Urine: 1.014 (ref 1.005–1.030)
WBC, UA: >50 WBC/hpf (ref 0–5)
pH: 5 (ref 5.0–8.0)

## 2023-02-06 LAB — CBC
HCT: 37.8 % (ref 36.0–46.0)
Hemoglobin: 12.5 g/dL (ref 12.0–15.0)
MCH: 33.8 pg (ref 26.0–34.0)
MCHC: 33.1 g/dL (ref 30.0–36.0)
MCV: 102.2 fL — ABNORMAL HIGH (ref 80.0–100.0)
Platelets: 197 10*3/uL (ref 150–400)
RBC: 3.7 MIL/uL — ABNORMAL LOW (ref 3.87–5.11)
RDW: 12.7 % (ref 11.5–15.5)
WBC: 8.9 10*3/uL (ref 4.0–10.5)
nRBC: 0 % (ref 0.0–0.2)

## 2023-02-06 LAB — COMPREHENSIVE METABOLIC PANEL
ALT: 16 U/L (ref 0–44)
AST: 19 U/L (ref 15–41)
Albumin: 3.6 g/dL (ref 3.5–5.0)
Alkaline Phosphatase: 79 U/L (ref 38–126)
Anion gap: 9 (ref 5–15)
BUN: 15 mg/dL (ref 8–23)
CO2: 27 mmol/L (ref 22–32)
Calcium: 8.9 mg/dL (ref 8.9–10.3)
Chloride: 98 mmol/L (ref 98–111)
Creatinine, Ser: 0.69 mg/dL (ref 0.44–1.00)
GFR, Estimated: >60 mL/min (ref >60)
Glucose, Bld: 96 mg/dL (ref 70–99)
Potassium: 3.8 mmol/L (ref 3.5–5.1)
Sodium: 134 mmol/L — ABNORMAL LOW (ref 135–145)
Total Bilirubin: 0.9 mg/dL (ref 0.3–1.2)
Total Protein: 6.8 g/dL (ref 6.5–8.1)

## 2023-02-06 MED ORDER — MORPHINE SULFATE (PF) 4 MG/ML IV SOLN
4.0000 mg | Freq: Once | INTRAVENOUS | Status: AC
  Administered 2023-02-06: 4 mg via INTRAVENOUS
  Filled 2023-02-06: qty 1

## 2023-02-06 MED ORDER — CEPHALEXIN 500 MG PO CAPS: 500.0000 mg | ORAL_CAPSULE | Freq: Two times a day (BID) | ORAL | 0 refills | Status: AC

## 2023-02-06 MED ORDER — IOHEXOL 300 MG/ML  SOLN
100.0000 mL | Freq: Once | INTRAMUSCULAR | Status: AC | PRN
  Administered 2023-02-06: 100 mL via INTRAVENOUS

## 2023-02-06 MED ORDER — FENTANYL CITRATE PF 50 MCG/ML IJ SOSY
50.0000 ug | PREFILLED_SYRINGE | Freq: Once | INTRAMUSCULAR | Status: AC
  Administered 2023-02-06: 50 ug via INTRAVENOUS
  Filled 2023-02-06: qty 1

## 2023-02-06 NOTE — ED Provider Notes (Signed)
[place] EMERGENCY DEPARTMENT AT [facility] Provider Note   CSN: [id number] Arrival date & time: 02/06/23  1126     History  Chief Complaint  Patient presents with   Marletta Lor    Doll Frazee is a 72 y.o. female with history of asthma, sleep apnea, PTSD, chronic pain on norco, osteopenie, avascular necrosis, restless legs who presents to the ER complaining of right lower back/flank pain. Had a mechanical fall 3 days ago where she fell backwards onto a case of [facility]. Thinks she struck her head on the washing machine, but denies LOC, not on blood thinners. Thought she originally just injured her back but since her pain is not getting any better, she is concerned she injured her kidney. Denies hematuria. Pain shoots down her right leg with any movement or walking. Has been taking her home pain medication and using lidocaine patches without relief.    Fall       Home Medications Prior to Admission medications   Medication Sig Start Date End Date Taking? Authorizing Provider  alendronate (FOSAMAX) 70 MG tablet Take 70 mg by mouth every Monday. Take with a full glass of water on an empty stomach in the morning   Yes [provider]  amLODipine (NORVASC) 10 MG tablet Take 10 mg by mouth in the morning.  10/27/18  Yes [provider]  ammonium lactate (AMLACTIN) 12 % cream Apply topically daily as needed for dry skin. 06/13/20  Yes [provider]  calcium carbonate (OSCAL) 1500 (600 Ca) MG TABS tablet Take 600 mg of elemental calcium by mouth 2 (two) times daily with a meal.   Yes [provider]  celecoxib (CELEBREX) 200 MG capsule Take 200 mg by mouth at bedtime.  11/07/16  Yes [provider]  cephALEXin (KEFLEX) 500 MG capsule Take 1 capsule (500 mg total) by mouth 2 (two) times daily for 5 days. 02/06/23 02/11/23 Yes Devonta Blanford T, PA-C  Cholecalciferol (VITAMIN D3) 125 MCG (5000 UT) CAPS Take 1 capsule by mouth in the  morning.   Yes [provider]  DULoxetine (CYMBALTA) 60 MG capsule Take 60 mg by mouth 2 (two) times daily.  12/20/16  Yes [provider]  HYDROcodone-acetaminophen (NORCO) 10-325 MG tablet Take 1 tablet by mouth 4 (four) times daily as needed for moderate pain. Patient taking differently: Take 1 tablet by mouth every 6 (six) hours as needed for moderate pain. 06/19/20  Yes Gherghe, Daylene Katayama, MD  Ipratropium-Albuterol (COMBIVENT) 20-100 MCG/ACT AERS respimat Take 1 puff by mouth every 6 (six) hours as needed for wheezing or shortness of breath.  12/05/16  Yes [provider]  levothyroxine (SYNTHROID) 112 MCG tablet Take 112 mcg by mouth in the morning.  12/21/16  Yes [provider]  loratadine (CLARITIN) 10 MG tablet Take 10 mg by mouth in the morning and at bedtime.   Yes [provider]  methocarbamol (ROBAXIN) 750 MG tablet Take 750 mg by mouth 3 (three) times daily as needed for muscle spasms.   Yes [provider]  Multiple Vitamins-Minerals (MULTIVITAMIN ADULT) TABS Take 1 tablet by mouth in the morning.    Yes [provider]  nortriptyline (PAMELOR) 25 MG capsule Take 75 mg by mouth at bedtime.   Yes [provider]  pantoprazole (PROTONIX) 40 MG tablet Take 40 mg by mouth 2 (two) times daily.  05/23/20  Yes [provider]  traZODone (DESYREL) 100 MG tablet Take 300 mg by mouth  at bedtime.  12/20/16  Yes [provider]  vitamin B-12 (CYANOCOBALAMIN) 1000 MCG tablet Take 1,000 mcg by mouth in the morning.    Yes [provider]  budesonide (PULMICORT) 0.25 MG/2ML nebulizer solution Take 2 mLs (0.25 mg total) by nebulization 2 (two) times daily. Patient not taking: Reported on 02/06/2023 06/19/20   Leatha Gilding, MD  metFORMIN (GLUCOPHAGE) 500 MG tablet Take 1 tablet (500 mg total) by mouth 2 (two) times daily with a meal. Patient not taking: No sig reported 06/19/20 06/19/21  Leatha Gilding, MD       Allergies    Erythromycin and Other    Review of Systems   Review of Systems  Genitourinary:  Positive for flank pain. Negative for hematuria.  Musculoskeletal:  Positive for back pain.  Neurological:  Negative for numbness.  All other systems reviewed and are negative.   Physical Exam Updated Vital Signs BP 133/76   Pulse 96   Temp 98.9 F (37.2 C) (Oral)   Resp 18   Ht 5\' 5"  (1.651 m)   Wt 74.8 kg   SpO2 98%   BMI 27.46 kg/m  Physical Exam Vitals and nursing note reviewed.  Constitutional:      Appearance: Normal appearance.  HENT:     Head: Normocephalic and atraumatic.  Eyes:     Conjunctiva/sclera: Conjunctivae normal.  Cardiovascular:     Rate and Rhythm: Normal rate and regular rhythm.  Pulmonary:     Effort: Pulmonary effort is normal. No respiratory distress.     Breath sounds: Normal breath sounds.  Abdominal:     General: There is no distension.     Palpations: Abdomen is soft.     Tenderness: There is no abdominal tenderness.  Musculoskeletal:       Back:     Comments: Area of ecchymoses about 3 cm in diameter with associated tenderness. No posterior rib tenderness to palpation or crepitus. No midline spinal tenderness, step offs or crepitus. Normal ROM of all areas of the spine.  Skin:    General: Skin is warm and dry.  Neurological:     General: No focal deficit present.     Mental Status: She is alert.     ED Results / Procedures / Treatments   Labs (all labs ordered are listed, but only abnormal results are displayed) Labs Reviewed  CBC - Abnormal; Notable for the following components:      Result Value   RBC 3.70 (*)    MCV 102.2 (*)    All other components within normal limits  COMPREHENSIVE METABOLIC PANEL - Abnormal; Notable for the following components:   Sodium 134 (*)    All other components within normal limits  URINALYSIS, ROUTINE W REFLEX MICROSCOPIC - Abnormal; Notable for the following components:   APPearance CLOUDY (*)     Protein, ur 30 (*)    Leukocytes,Ua LARGE (*)    Bacteria, UA RARE (*)    Non Squamous Epithelial 0-5 (*)    All other components within normal limits    EKG None  Radiology CT T-SPINE NO CHARGE  Result Date: 02/06/2023 CLINICAL DATA:  Trauma, fall EXAM: CT THORACIC SPINE WITHOUT CONTRAST TECHNIQUE: Multidetector CT images of the thoracic were obtained using the standard protocol without intravenous contrast. RADIATION DOSE REDUCTION: This exam was performed according to the departmental dose-optimization program which includes automated exposure control, adjustment of the mA and/or kV according to patient size and/or use of iterative reconstruction technique.  COMPARISON:  02/25/2019 FINDINGS: Alignment: Alignment of posterior margins of vertebral bodies has not changed. Vertebrae: There is a old compression fracture in the body of T12 vertebra. There is retropulsion of upper endplate of body of T12 vertebra. There is previous vertebroplasty in T12 vertebral body. No new fractures are seen. Paraspinal and other soft tissues: Paraspinal soft tissues are unremarkable. There is neurostimulator lead in the thoracic spinal canal. Disc levels: There is no significant narrowing of neural foramina. IMPRESSION: Old compression fracture with 50% decrease in height of body of T12 vertebra has not changed. There is previous vertebroplasty in the body of T12 vertebra. No recent fracture is seen in thoracic spine. There is no significant spinal stenosis or significant encroachment of neural foramina. Electronically Signed   By: Ernie Avena M.D.   On: 02/06/2023 16:25   CT L-SPINE NO CHARGE  Result Date: 02/06/2023 CLINICAL DATA:  Trauma, fall EXAM: CT LUMBAR SPINE WITHOUT CONTRAST TECHNIQUE: Multidetector CT imaging of the lumbar spine was performed without intravenous contrast administration. Multiplanar CT image reconstructions were also generated. RADIATION DOSE REDUCTION: This exam was performed  according to the departmental dose-optimization program which includes automated exposure control, adjustment of the mA and/or kV according to patient size and/or use of iterative reconstruction technique. COMPARISON:  None Available. FINDINGS: Segmentation: There are 5 non-rib-bearing vertebrae in lumbar region. Alignment: Alignment of posterior margins of vertebral bodies appears normal. Vertebrae: No recent fracture is seen. Paraspinal and other soft tissues: Paraspinal soft tissues are unremarkable. There is no significant central spinal stenosis. Disc levels: There is a narrowing of neural foramina by facet hypertrophy and bulging of the annulus from L3-S1 levels. IMPRESSION: No recent fracture is seen and lumbar spine. There is no central spinal stenosis. There is narrowing of neural foramina in the lower lumbar spine caused by bulging annulus and facet hypertrophy. Electronically Signed   By: Ernie Avena M.D.   On: 02/06/2023 16:22   CT Cervical Spine Wo Contrast  Result Date: 02/06/2023 CLINICAL DATA:  Trauma, fall EXAM: CT CERVICAL SPINE WITHOUT CONTRAST TECHNIQUE: Multidetector CT imaging of the cervical spine was performed without intravenous contrast. Multiplanar CT image reconstructions were also generated. RADIATION DOSE REDUCTION: This exam was performed according to the departmental dose-optimization program which includes automated exposure control, adjustment of the mA and/or kV according to patient size and/or use of iterative reconstruction technique. COMPARISON:  None Available. FINDINGS: Alignment: Alignment of posterior margins of vertebral bodies is within normal limits. There is minimal levoscoliosis. Skull base and vertebrae: No recent fracture is seen. Degenerative changes are noted with bony spurs at multiple levels. Soft tissues and spinal canal: There is extrinsic pressure over the ventral margin of thecal sac caused by posterior bony spurs from C4-C7 levels. Disc levels:  There is encroachment of neural foramina by bony spurs from C4 to C7 levels. Upper chest: No acute findings are seen. Other: Thyroid is smaller than usual incised. IMPRESSION: No recent fracture is seen in cervical spine. Cervical spondylosis with encroachment of neural foramina from C4-C7 levels. Electronically Signed   By: Ernie Avena M.D.   On: 02/06/2023 16:19   CT Head Wo Contrast  Result Date: 02/06/2023 CLINICAL DATA:  Trauma, fall EXAM: CT HEAD WITHOUT CONTRAST TECHNIQUE: Contiguous axial images were obtained from the base of the skull through the vertex without intravenous contrast. RADIATION DOSE REDUCTION: This exam was performed according to the departmental dose-optimization program which includes automated exposure control, adjustment of the mA and/or kV according  to patient size and/or use of iterative reconstruction technique. COMPARISON:  None Available. FINDINGS: Brain: No acute intracranial findings are seen. There are no signs of bleeding within the cranium. Ventricles are nondilated. Cortical sulci are prominent. Vascular: Unremarkable. Skull: No recent fracture is seen. Sinuses/Orbits: There is mucosal thickening in ethmoid sinus. Other: None. IMPRESSION: No acute intracranial findings are seen in noncontrast CT brain. Electronically Signed   By: Ernie Avena M.D.   On: 02/06/2023 16:11   CT CHEST ABDOMEN PELVIS W CONTRAST  Result Date: 02/06/2023 CLINICAL DATA:  Trauma, fall, right flank pain EXAM: CT CHEST, ABDOMEN, AND PELVIS WITH CONTRAST TECHNIQUE: Multidetector CT imaging of the chest, abdomen and pelvis was performed following the standard protocol during bolus administration of intravenous contrast. RADIATION DOSE REDUCTION: This exam was performed according to the departmental dose-optimization program which includes automated exposure control, adjustment of the mA and/or kV according to patient size and/or use of iterative reconstruction technique. CONTRAST:   OMNIPAQUE IOHEXOL 300 MG/ML  SOLN COMPARISON:  CT chest done on 06/13/2020 FINDINGS: CT CHEST FINDINGS Cardiovascular: Major vascular structures and mediastinum appear intact. Mediastinum/Nodes: There is no mediastinal hematoma. There is prominent pericardial recess adjacent to the aortic arch. Lungs/Pleura: There is no focal pulmonary consolidation. There is subtle increase in interstitial markings in the periphery of both lungs suggesting scarring. There is no pleural effusion or pneumothorax. Musculoskeletal: There is old compression fracture and vertebroplasty in the body T12 vertebra. There is neurostimulator lead in lower thoracic spinal canal. CT ABDOMEN PELVIS FINDINGS Hepatobiliary: There is fatty infiltration of the liver. No focal abnormalities are seen. Gallbladder is distended. There is subtle increased density in the dependent portion of gallbladder lumen. There is no wall thickening in gallbladder. Pancreas: No focal abnormalities are seen. Spleen: Unremarkable. Adrenals/Urinary Tract: Adrenals are unremarkable. There is no hydronephrosis. There are no renal or ureteral stones. There is mild wall thickening in the anterior aspect of the urinary bladder. Stomach/Bowel: There is moderate sized fixed hiatal hernia. There is wall thickening in lower thoracic esophagus, possibly suggesting reflux esophagitis. Stomach is not distended. Small bowel loops are not dilated. The appendix is not distinctly seen. There is no focal pericecal inflammation. There is no significant wall thickening in colon. There is no pericolic stranding. Vascular/Lymphatic: Major vascular structures appear intact. There is no evidence of retroperitoneal hematoma. Reproductive: Uterus is not seen. Other: There is no ascites or pneumoperitoneum. There is previous left mastectomy. Reconstruction prosthesis is seen in right breast. Musculoskeletal: No acute findings are seen. There is a old compression fracture and vertebroplasty  in the body of T12 vertebra. Neurostimulator lead is noted in lower thoracic spinal canal. IMPRESSION: No acute findings are seen and CT scan of chest, abdomen and pelvis. There is no evidence of mediastinal or retroperitoneal hematoma. There is no laceration in solid organs. There is no ascites or pneumoperitoneum. There is no focal pulmonary consolidation. Fatty liver. Possible gallbladder stone. There is wall thickening in the anterior aspect of urinary bladder suggesting acute or chronic inflammation. There is moderate sized hiatal hernia. There is wall thickening in lower thoracic esophagus suggesting possible reflux esophagitis. Old compression fracture and vertebroplasty are noted in body of T12 vertebra. Other findings as described in the body of the report. Electronically Signed   By: Ernie Avena M.D.   On: 02/06/2023 16:08    Procedures Procedures    Medications Ordered in ED Medications  fentaNYL (SUBLIMAZE) injection 50 mcg (50 mcg Intravenous Given 02/06/23 1253)  morphine (PF) 4 MG/ML injection 4 mg (4 mg Intravenous Given 02/06/23 1513)  iohexol (OMNIPAQUE) 300 MG/ML solution 100 mL (100 mLs Intravenous Contrast Given 02/06/23 1451)    ED Course/ Medical Decision Making/ A&P                             Medical Decision Making Amount and/or Complexity of Data Reviewed Labs: ordered. Radiology: ordered.  Risk Prescription drug management.   This patient is a 72 y.o. female  who presents to the ED for concern of right back/flank pain after fall 3 days ago.   Past Medical History / Co-morbidities: asthma, sleep apnea, PTSD, chronic pain on norco, osteopenie, avascular necrosis, restless legs  Additional history: Chart reviewed. Pertinent results include: normally on 10-325 Norco for pain, 1 tab Q6 PRN. PDMP reviewed, most recently filled on 3/24. Consistent with patient report.   Physical Exam: Physical exam performed. The pertinent findings include: Normal vital  signs, no acute distress. Ecchymoses to the right lower back with tenderness. No focal bony tenderness or crepitus. Chest wall stable, abdomen soft and non-tender.   Lab Tests/Imaging studies: I personally interpreted labs/imaging and the pertinent results include:  no leukocytosis, normal hemoglobin. CMP unremarkable. Urinalysis with large leukocytes, > 50 WBCs, and WBC clumps.  Trauma scans including CT head, cervical/thoracic/lumbar spine, and chest/abdomen/pelvis all without acute traumatic findings. Old T12 compression fracture. I agree with the radiologist interpretation.  Medications: I ordered medication including fentanyl and morphine.  I have reviewed the patients home medicines and have made adjustments as needed. Pain significantly improved since morphine.   Disposition: After consideration of the diagnostic results and the patients response to treatment, I feel that emergency department workup does not suggest an emergent condition requiring admission or immediate intervention beyond what has been performed at this time. Upon discussing results with patient, patient believes her symptoms could be related to a UTI as she has had some urinary discomfort. The plan is: discharge to home with antibiotics for UTI and continue home pain regimen. The patient is safe for discharge and has been instructed to return immediately for worsening symptoms, change in symptoms or any other concerns.  Final Clinical Impression(s) / ED Diagnoses Final diagnoses:  Fall, initial encounter  Right flank pain  Acute cystitis without hematuria    Rx / DC Orders ED Discharge Orders          Ordered    cephALEXin (KEFLEX) 500 MG capsule  2 times daily        02/06/23 1705           Portions of this report may have been transcribed using voice recognition software. Every effort was made to ensure accuracy; however, inadvertent computerized transcription errors may be present.    Jeanella Flattery 02/06/23 1720    Pricilla Loveless, MD 02/07/23 1600

## 2023-02-06 NOTE — ED Triage Notes (Signed)
Pt had a fall on Saturday after she lost her balance and fell, hitting some drinks on the floor.  C/o right flank pain since.  Denies any blood in her urine. Denies hitting her head or taking any blood thinners.

## 2023-02-06 NOTE — Discharge Instructions (Addendum)
You were seen in the ER for pain after a fall.  As we discussed, your images were all reassuring today. I did not see any evidence of broken bones or internal bleeding.  Your urine sample does have some evidence of a urinary tract infection. I am sending some antibiotics to your pharmacy.  Continue to monitor how you're doing and return to the ER for new or worsening symptoms.

## 2023-02-06 NOTE — ED Notes (Signed)
Administered pain medication when pt returned from CT

## 2023-02-26 ENCOUNTER — Other Ambulatory Visit (HOSPITAL_COMMUNITY): Payer: Self-pay | Admitting: Orthopedic Surgery

## 2023-02-26 DIAGNOSIS — S42292D Other displaced fracture of upper end of left humerus, subsequent encounter for fracture with routine healing: Secondary | ICD-10-CM

## 2023-04-26 ENCOUNTER — Ambulatory Visit (HOSPITAL_COMMUNITY)
Admission: RE | Admit: 2023-04-26 | Discharge: 2023-04-26 | Disposition: A | Discharge status: Home / Self Care | Payer: Medicare Other | Source: Ambulatory Visit | Attending: Orthopedic Surgery | Admitting: Orthopedic Surgery

## 2023-04-26 DIAGNOSIS — S42292D Other displaced fracture of upper end of left humerus, subsequent encounter for fracture with routine healing: Secondary | ICD-10-CM | POA: Diagnosis present

## 2023-05-01 ENCOUNTER — Encounter (HOSPITAL_COMMUNITY): Payer: Self-pay

## 2023-05-01 NOTE — Progress Notes (Signed)
COVID Vaccine Completed:  Yes  Date of COVID positive in last 90 days:  PCP -  Cardiologist -   Chest x-ray - CT chest 02-06-23 Epic EKG -  Stress Test -  ECHO - 06-14-20 Epic Cardiac Cath -  Pacemaker/ICD device last checked: Spinal Cord Stimulator:  Bowel Prep -   Sleep Study - Yes, +sleep apnea CPAP -   Fasting Blood Sugar -  Checks Blood Sugar _____ times a day  Last dose of GLP1 agonist-  N/A GLP1 instructions:  N/A   Last dose of SGLT-2 inhibitors-  N/A SGLT-2 instructions: N/A   Blood Thinner Instructions:  Time Aspirin Instructions: Last Dose:  Activity level:  Can go up a flight of stairs and perform activities of daily living without stopping and without symptoms of chest pain or shortness of breath.  Able to exercise without symptoms  Unable to go up a flight of stairs without symptoms of     Anesthesia review: COPD  Patient denies shortness of breath, fever, cough and chest pain at PAT appointment  Patient verbalized understanding of instructions that were given to them at the PAT appointment. Patient was also instructed that they will need to review over the PAT instructions again at home before surgery.

## 2023-05-01 NOTE — Patient Instructions (Addendum)
SURGICAL WAITING ROOM VISITATION Patients having surgery or a procedure may have no more than 2 support people in the waiting area - these visitors may rotate.    Children under the age of 86 must have an adult with them who is not the patient.  If the patient needs to stay at the hospital during part of their recovery, the visitor guidelines for inpatient rooms apply. Pre-op nurse will coordinate an appropriate time for 1 support person to accompany patient in pre-op.  This support person may not rotate.    Please refer to the [facility] website for the visitor guidelines for Inpatients (after your surgery is over and you are in a regular room).       Your procedure is scheduled on: 05-24-23   Report to [facility] Main Entrance    Report to admitting at 5:15 AM   Call this number if you have problems the morning of surgery [phone]   Do not eat food :After Midnight.   After Midnight you may have the following liquids until 4:15 AM DAY OF SURGERY  Water Non-Citrus Juices (without pulp, NO RED-Apple, White grape, White cranberry) Black Coffee (NO MILK/CREAM OR CREAMERS, sugar ok)  Clear Tea (NO MILK/CREAM OR CREAMERS, sugar ok) regular and decaf                             Plain Jell-O (NO RED)                                           Fruit ices (not with fruit pulp, NO RED)                                     Popsicles (NO RED)                                                               Sports drinks like Gatorade (NO RED)   The day of surgery:  Drink ONE (1) Pre-Surgery Clear Ensure  at 4:15 AM the morning of surgery. Drink in one sitting. Do not sip.  This drink was given to you during your hospital  pre-op appointment visit. Nothing else to drink after completing the Pre-Surgery Clear Ensure          If you have questions, please contact your surgeon's office.  FOLLOW  ANY ADDITIONAL PRE OP INSTRUCTIONS YOU RECEIVED FROM YOUR SURGEON'S OFFICE!!!   Oral  Hygiene is also important to reduce your risk of infection.                                    Remember - BRUSH YOUR TEETH THE MORNING OF SURGERY WITH YOUR REGULAR TOOTHPASTE   Do NOT smoke after Midnight   Take these medicines the morning of surgery with A SIP OF WATER:   Amlodipine  Duloxetine  Levothyroxine  Claritin  Pantoprazole  Bring CPAP mask and tubing day of surgery.  You may not have any metal on your body including hair pins, jewelry, and body piercing             Do not wear make-up, lotions, powders, perfumes or deodorant  Do not wear nail polish including gel and S&S, artificial/acrylic nails, or any other type of covering on natural nails including finger and toenails. If you have artificial nails, gel coating, etc. that needs to be removed by a nail salon please have this removed prior to surgery or surgery may need to be canceled/ delayed if the surgeon/ anesthesia feels like they are unable to be safely monitored.   Do not shave  48 hours prior to surgery.          Do not bring valuables to the hospital. [facility] IS NOT RESPONSIBLE   FOR VALUABLES.   Contacts, dentures or bridgework may not be worn into surgery.   Bring small overnight bag day of surgery.   DO NOT BRING YOUR HOME MEDICATIONS TO THE HOSPITAL. PHARMACY WILL DISPENSE MEDICATIONS LISTED ON YOUR MEDICATION LIST TO YOU DURING YOUR ADMISSION IN THE HOSPITAL!    Special Instructions: Bring a copy of your healthcare power of attorney and living will documents the day of surgery if you haven't scanned them before.              Please read over the following fact sheets you were given: IF YOU HAVE QUESTIONS ABOUT YOUR PRE-OP INSTRUCTIONS PLEASE CALL [phone] [facility]  If you received a COVID test during your pre-op visit  it is requested that you wear a mask when out in public, stay away from anyone that may not be feeling well and notify your surgeon if you develop symptoms.  If you test positive for Covid or have been in contact with anyone that has tested positive in the last 10 days please notify you surgeon.  [place]- Preparing for Total Shoulder Arthroplasty    Before surgery, you can play an important role. Because skin is not sterile, your skin needs to be as free of germs as possible. You can reduce the number of germs on your skin by using the following products. Benzoyl Peroxide Gel Reduces the number of germs present on the skin Applied twice a day to shoulder area starting two days before surgery    ==================================================================  Please follow these instructions carefully:  BENZOYL PEROXIDE 5% GEL  Please do not use if you have an allergy to benzoyl peroxide.   If your skin becomes reddened/irritated stop using the benzoyl peroxide.  Starting two days before surgery, apply as follows: Apply benzoyl peroxide in the morning and at night. Apply after taking a shower. If you are not taking a shower clean entire shoulder front, back, and side along with the armpit with a clean wet washcloth.  Place a quarter-sized dollop on your shoulder and rub in thoroughly, making sure to cover the front, back, and side of your shoulder, along with the armpit.   2 days before ____ AM   ____ PM              1 day before ____ AM   ____ PM                         Do this twice a day for two days.  (Last application is the night before surgery, AFTER using the CHG soap as described below).  Do NOT apply benzoyl peroxide gel  on the day of surgery.    Pre-operative 5 CHG Bath Instructions   You can play a key role in reducing the risk of infection after surgery. Your skin needs to be as free of germs as possible. You can reduce the number of germs on your skin by washing with CHG (chlorhexidine gluconate) soap before surgery. CHG is an antiseptic soap that kills germs and continues to kill germs even after washing.   DO NOT  use if you have an allergy to chlorhexidine/CHG or antibacterial soaps. If your skin becomes reddened or irritated, stop using the CHG and notify one of our RNs at  [phone] .   Please shower with the CHG soap starting 4 days before surgery using the following schedule:     Please keep in mind the following:  DO NOT shave, including legs and underarms, starting the day of your first shower.   You may shave your face at any point before/day of surgery.  Place clean sheets on your bed the day you start using CHG soap. Use a clean washcloth (not used since being washed) for each shower. DO NOT sleep with pets once you start using the CHG.   CHG Shower Instructions:  If you choose to wash your hair and private area, wash first with your normal shampoo/soap.  After you use shampoo/soap, rinse your hair and body thoroughly to remove shampoo/soap residue.  Turn the water OFF and apply about 3 tablespoons (45 ml) of CHG soap to a CLEAN washcloth.  Apply CHG soap ONLY FROM YOUR NECK DOWN TO YOUR TOES (washing for 3-5 minutes)  DO NOT use CHG soap on face, private areas, open wounds, or sores.  Pay special attention to the area where your surgery is being performed.  If you are having back surgery, having someone wash your back for you may be helpful. Wait 2 minutes after CHG soap is applied, then you may rinse off the CHG soap.  Pat dry with a clean towel  Put on clean clothes/pajamas   If you choose to wear lotion, please use ONLY the CHG-compatible lotions on the back of this paper.     Additional instructions for the day of surgery: DO NOT APPLY any lotions, deodorants, cologne, or perfumes.   Put on clean/comfortable clothes.  Brush your teeth.  Ask your nurse before applying any prescription medications to the skin.      CHG Compatible Lotions   Aveeno Moisturizing lotion  Cetaphil Moisturizing Cream  Cetaphil Moisturizing Lotion  Clairol Herbal Essence Moisturizing Lotion,  Dry Skin  Clairol Herbal Essence Moisturizing Lotion, Extra Dry Skin  Clairol Herbal Essence Moisturizing Lotion, Normal Skin  Curel Age Defying Therapeutic Moisturizing Lotion with Alpha Hydroxy  Curel Extreme Care Body Lotion  Curel Soothing Hands Moisturizing Hand Lotion  Curel Therapeutic Moisturizing Cream, Fragrance-Free  Curel Therapeutic Moisturizing Lotion, Fragrance-Free  Curel Therapeutic Moisturizing Lotion, Original Formula  Eucerin Daily Replenishing Lotion  Eucerin Dry Skin Therapy Plus Alpha Hydroxy Crme  Eucerin Dry Skin Therapy Plus Alpha Hydroxy Lotion  Eucerin Original Crme  Eucerin Original Lotion  Eucerin Plus Crme Eucerin Plus Lotion  Eucerin TriLipid Replenishing Lotion  Keri Anti-Bacterial Hand Lotion  Keri Deep Conditioning Original Lotion Dry Skin Formula Softly Scented  Keri Deep Conditioning Original Lotion, Fragrance Free Sensitive Skin Formula  Keri Lotion Fast Absorbing Fragrance Free Sensitive Skin Formula  Keri Lotion Fast Absorbing Softly Scented Dry Skin Formula  Keri Original Lotion  Keri Skin Renewal Lotion Sellers  Smooth Lotion  Keri Silky Smooth Sensitive Skin Lotion  Nivea Body Creamy Conditioning Oil  Nivea Body Extra Enriched Lotion  Nivea Body Original Market researcher Moisturizing Lotion Nivea Crme  Nivea Skin Firming Lotion  NutraDerm 30 Skin Lotion  NutraDerm Skin Lotion  NutraDerm Therapeutic Skin Cream  NutraDerm Therapeutic Skin Lotion  ProShield Protective Hand Cream  Provon moisturizing lotion   PATIENT SIGNATURE_________________________________  NURSE SIGNATURE__________________________________  ________________________________________________________________________

## 2023-05-13 ENCOUNTER — Encounter (HOSPITAL_COMMUNITY)
Admission: RE | Admit: 2023-05-13 | Discharge: 2023-05-13 | Disposition: A | Discharge status: Home / Self Care | Payer: Medicare Other | Source: Ambulatory Visit | Attending: Orthopedic Surgery | Admitting: Orthopedic Surgery

## 2023-05-13 ENCOUNTER — Encounter (HOSPITAL_COMMUNITY): Payer: Self-pay

## 2023-05-13 ENCOUNTER — Other Ambulatory Visit: Payer: Self-pay

## 2023-05-13 VITALS — BP 133/78 | HR 98 | Temp 97.8°F | Ht 66.0 in | Wt 154.0 lb

## 2023-05-13 DIAGNOSIS — Z01810 Encounter for preprocedural cardiovascular examination: Secondary | ICD-10-CM | POA: Insufficient documentation

## 2023-05-13 DIAGNOSIS — Z79899 Other long term (current) drug therapy: Secondary | ICD-10-CM | POA: Diagnosis not present

## 2023-05-13 DIAGNOSIS — R9431 Abnormal electrocardiogram [ECG] [EKG]: Secondary | ICD-10-CM | POA: Diagnosis not present

## 2023-05-13 DIAGNOSIS — Z01818 Encounter for other preprocedural examination: Secondary | ICD-10-CM

## 2023-05-13 DIAGNOSIS — I10 Essential (primary) hypertension: Secondary | ICD-10-CM | POA: Insufficient documentation

## 2023-05-13 DIAGNOSIS — J45909 Unspecified asthma, uncomplicated: Secondary | ICD-10-CM | POA: Diagnosis not present

## 2023-05-13 DIAGNOSIS — Z01812 Encounter for preprocedural laboratory examination: Secondary | ICD-10-CM | POA: Insufficient documentation

## 2023-05-13 HISTORY — DX: Hypothyroidism, unspecified: E03.9

## 2023-05-13 HISTORY — DX: Essential (primary) hypertension: I10

## 2023-05-13 HISTORY — DX: Malignant (primary) neoplasm, unspecified: C80.1

## 2023-05-13 LAB — BASIC METABOLIC PANEL
Anion gap: 9 (ref 5–15)
BUN: 21 mg/dL (ref 8–23)
CO2: 28 mmol/L (ref 22–32)
Calcium: 9 mg/dL (ref 8.9–10.3)
Chloride: 98 mmol/L (ref 98–111)
Creatinine, Ser: 0.48 mg/dL (ref 0.44–1.00)
GFR, Estimated: >60 mL/min (ref >60)
Glucose, Bld: 125 mg/dL — ABNORMAL HIGH (ref 70–99)
Potassium: 3.6 mmol/L (ref 3.5–5.1)
Sodium: 135 mmol/L (ref 135–145)

## 2023-05-13 LAB — CBC
HCT: 35.9 % — ABNORMAL LOW (ref 36.0–46.0)
Hemoglobin: 11.4 g/dL — ABNORMAL LOW (ref 12.0–15.0)
MCH: 33 pg (ref 26.0–34.0)
MCHC: 31.8 g/dL (ref 30.0–36.0)
MCV: 104.1 fL — ABNORMAL HIGH (ref 80.0–100.0)
Platelets: 343 10*3/uL (ref 150–400)
RBC: 3.45 MIL/uL — ABNORMAL LOW (ref 3.87–5.11)
RDW: 13.1 % (ref 11.5–15.5)
WBC: 12.2 10*3/uL — ABNORMAL HIGH (ref 4.0–10.5)
nRBC: 0 % (ref 0.0–0.2)

## 2023-05-13 NOTE — Progress Notes (Signed)
PCP: [facility]. Cardiologist - Dr. Yehuda Budd. : Clearance: Chart: 03/19/23 Echo: 10/2021 Stress test: 10/2016 Hx: HTN,Tachycardia, Unable to go a flight of stairs with out SOB.

## 2023-05-14 NOTE — Progress Notes (Addendum)
PCR: + MRSA./+ STAPH 

## 2023-05-15 NOTE — Anesthesia Preprocedure Evaluation (Addendum)
Anesthesia Evaluation  Patient identified by MRN, date of birth, ID band Patient awake    Reviewed: Allergy & Precautions, NPO status , Patient's Chart, lab work & pertinent test results  Airway Mallampati: II  TM Distance: >3 FB Neck ROM: Full    Dental  (+) Dental Advisory Given, Teeth Intact, Chipped   Pulmonary asthma , sleep apnea , pneumonia, COPD   Pulmonary exam normal breath sounds clear to auscultation       Cardiovascular hypertension, Pt. on medications  Rhythm:Regular Rate:Tachycardia  Echo 2021  1. Left ventricular ejection fraction, by estimation, is 60 to 65%. The left ventricle has normal function. The left ventricle has no regional wall motion abnormalities. Left ventricular diastolic parameters are consistent with Grade I diastolic dysfunction (impaired relaxation).   2. Right ventricular systolic function is normal. The right ventricular size is normal. There is normal pulmonary artery systolic pressure.   3. Left atrial size was severely dilated.   4. The mitral valve is normal in structure. Trivial mitral valve regurgitation. No evidence of mitral stenosis.   5. The aortic valve is tricuspid. Aortic valve regurgitation is not visualized. No aortic stenosis is present.   6. The inferior vena cava is dilated in size with >50% respiratory variability, suggesting right atrial pressure of 8 mmHg.     Neuro/Psych  PSYCHIATRIC DISORDERS Anxiety Depression    negative neurological ROS     GI/Hepatic negative GI ROS, Neg liver ROS,,,  Endo/Other  Hypothyroidism    Renal/GU negative Renal ROS     Musculoskeletal  (+) Arthritis ,    Abdominal   Peds  Hematology  (+) Blood dyscrasia, anemia   Anesthesia Other Findings   Reproductive/Obstetrics                             Anesthesia Physical Anesthesia Plan  ASA: 3  Anesthesia Plan: General   Post-op Pain Management: Regional  block* and Tylenol PO (pre-op)*   Induction: Intravenous  PONV Risk Score and Plan: 3 and Ondansetron, Dexamethasone and Treatment may vary due to age or medical condition  Airway Management Planned: Oral ETT  Additional Equipment:   Intra-op Plan:   Post-operative Plan: Extubation in OR  Informed Consent: I have reviewed the patients History and Physical, chart, labs and discussed the procedure including the risks, benefits and alternatives for the proposed anesthesia with the patient or authorized representative who has indicated his/her understanding and acceptance.     Dental advisory given  Plan Discussed with: CRNA  Anesthesia Plan Comments: (See PAT note 05/13/2023)       Anesthesia Quick Evaluation

## 2023-05-15 NOTE — Progress Notes (Signed)
Anesthesia Chart Review   Case: [id number] Date/Time: 05/24/23 0700   Procedure: REVERSE SHOULDER ARTHROPLASTY (Left: Shoulder) - 130   Anesthesia type: Choice   Pre-op diagnosis: Left shoulder malunion of humerus   Location: [facility] ROOM 08 / [facility]   Surgeons: Yolonda Kida, MD       DISCUSSION:72 y.o. never smoker with h/o HTN, asthma, chronic LBBB, PSVT, left shoulder malunion of humerus scheduled for above procedure 05/24/2023 with Dr. Yolonda Kida.   Note received from cardiologist 03/19/2023. Per note, " this is to inform you that you have hypertension patient had an echocardiogram performed on 11/03/2021 which revealed EF 60 to 65%, normal RV size and function, normal PA pressure, and no significant valvular disease.  This patient had a Lexiscan Cardiolite stress test performed in 11/03/2016 which showed no ischemia.  This patient had moderately good exercise tolerance and no symptoms of angina.  I feel this patient is low risk from a cardiac standpoint to proceed with his orthopedic surgery."  Clearance from PCP on chart which states patient is low risk for planned procedure. VS: BP 133/78   Pulse 98   Temp 36.6 C (Oral)   Ht 5\' 6"  (1.676 m)   Wt 69.9 kg   SpO2 94%   BMI 24.86 kg/m   PROVIDERS: System, Provider Not In  Yehuda Budd, MD is Cardiologist with [facility]: Labs reviewed: Acceptable for surgery. (all labs ordered are listed, but only abnormal results are displayed)  Labs Reviewed  SURGICAL PCR SCREEN - Abnormal; Notable for the following components:      Result Value   MRSA, PCR POSITIVE (*)    Staphylococcus aureus POSITIVE (*)    All other components within normal limits  BASIC METABOLIC PANEL - Abnormal; Notable for the following components:   Glucose, Bld 125 (*)    All other components within normal limits  CBC - Abnormal; Notable for the following components:   WBC 12.2 (*)    RBC 3.45 (*)    Hemoglobin 11.4 (*)    HCT  35.9 (*)    MCV 104.1 (*)    All other components within normal limits     IMAGES:   EKG:   CV:  Past Medical History:  Diagnosis Date   Asthma    Avascular necrosis (HCC)    Cancer (HCC)    Nasal cancer   Chronic pain syndrome    Chronic respiratory failure (HCC)    Compression fracture of T12 vertebra (HCC)    Hypertension    Hypothyroidism    Osteoarthritis    Osteopenia    PTSD (post-traumatic stress disorder)    Restless leg syndrome    Severe anemia    Sleep apnea     Past Surgical History:  Procedure Laterality Date   ABDOMINAL HYSTERECTOMY     ANKLE FUSION Left 02/2009   APPENDECTOMY     BLADDER REPAIR     BREAST SURGERY     CATARACT EXTRACTION, BILATERAL     FRACTURE SURGERY     IR VERTEBROPLASTY CERV/THOR BX INC UNI/BIL INC/INJECT/IMAGING  03/19/2019   SPINAL CORD STIMULATOR IMPLANT     TONSILLECTOMY     VENOUS ABLATION Bilateral    WRIST SURGERY  08/2013    MEDICATIONS:  alendronate (FOSAMAX) 70 MG tablet   budesonide (PULMICORT) 0.25 MG/2ML nebulizer solution   calcium carbonate (OSCAL) 1500 (600 Ca) MG TABS tablet   celecoxib (CELEBREX) 200 MG capsule   Cholecalciferol (  VITAMIN D3) 125 MCG (5000 UT) CAPS   DULoxetine (CYMBALTA) 60 MG capsule   furosemide (LASIX) 20 MG tablet   HYDROcodone-acetaminophen (NORCO) 10-325 MG tablet   Ipratropium-Albuterol (COMBIVENT) 20-100 MCG/ACT AERS respimat   levothyroxine (SYNTHROID) 112 MCG tablet   loratadine (CLARITIN) 10 MG tablet   metFORMIN (GLUCOPHAGE) 500 MG tablet   methocarbamol (ROBAXIN) 500 MG tablet   nortriptyline (PAMELOR) 50 MG capsule   traZODone (DESYREL) 100 MG tablet   vitamin B-12 (CYANOCOBALAMIN) 1000 MCG tablet   No current facility-administered medications for this encounter.    Jodell Cipro Ward, PA-C [facility]Surgical [facility] [phone]

## 2023-05-24 ENCOUNTER — Encounter (HOSPITAL_COMMUNITY): Payer: Self-pay | Admitting: Orthopedic Surgery

## 2023-05-24 ENCOUNTER — Observation Stay (HOSPITAL_COMMUNITY)
Admission: RE | Admit: 2023-05-24 | Discharge: 2023-05-26 | Disposition: A | Discharge status: Home / Self Care | Payer: Medicare Other | Attending: Orthopedic Surgery | Admitting: Orthopedic Surgery

## 2023-05-24 ENCOUNTER — Observation Stay (HOSPITAL_COMMUNITY): Payer: Medicare Other

## 2023-05-24 ENCOUNTER — Other Ambulatory Visit: Payer: Self-pay

## 2023-05-24 ENCOUNTER — Ambulatory Visit (HOSPITAL_COMMUNITY): Payer: Medicare Other | Admitting: Physician Assistant

## 2023-05-24 ENCOUNTER — Ambulatory Visit (HOSPITAL_COMMUNITY): Payer: Medicare Other | Admitting: Anesthesiology

## 2023-05-24 ENCOUNTER — Encounter (HOSPITAL_COMMUNITY)
Admission: RE | Disposition: A | Discharge status: Home / Self Care | Payer: Self-pay | Source: Home / Self Care | Attending: Orthopedic Surgery

## 2023-05-24 DIAGNOSIS — Z79899 Other long term (current) drug therapy: Secondary | ICD-10-CM | POA: Diagnosis not present

## 2023-05-24 DIAGNOSIS — E039 Hypothyroidism, unspecified: Secondary | ICD-10-CM | POA: Diagnosis not present

## 2023-05-24 DIAGNOSIS — Z8522 Personal history of malignant neoplasm of nasal cavities, middle ear, and accessory sinuses: Secondary | ICD-10-CM | POA: Diagnosis not present

## 2023-05-24 DIAGNOSIS — Z7984 Long term (current) use of oral hypoglycemic drugs: Secondary | ICD-10-CM | POA: Diagnosis not present

## 2023-05-24 DIAGNOSIS — J441 Chronic obstructive pulmonary disease with (acute) exacerbation: Secondary | ICD-10-CM

## 2023-05-24 DIAGNOSIS — S42292P Other displaced fracture of upper end of left humerus, subsequent encounter for fracture with malunion: Principal | ICD-10-CM | POA: Insufficient documentation

## 2023-05-24 DIAGNOSIS — I10 Essential (primary) hypertension: Secondary | ICD-10-CM

## 2023-05-24 DIAGNOSIS — J45909 Unspecified asthma, uncomplicated: Secondary | ICD-10-CM | POA: Diagnosis not present

## 2023-05-24 DIAGNOSIS — X58XXXA Exposure to other specified factors, initial encounter: Secondary | ICD-10-CM | POA: Insufficient documentation

## 2023-05-24 DIAGNOSIS — S42302P Unspecified fracture of shaft of humerus, left arm, subsequent encounter for fracture with malunion: Secondary | ICD-10-CM

## 2023-05-24 DIAGNOSIS — Z96612 Presence of left artificial shoulder joint: Principal | ICD-10-CM

## 2023-05-24 HISTORY — PX: REVERSE SHOULDER ARTHROPLASTY: SHX5054

## 2023-05-24 LAB — CBC
HCT: 35.4 % — ABNORMAL LOW (ref 36.0–46.0)
Hemoglobin: 11.5 g/dL — ABNORMAL LOW (ref 12.0–15.0)
MCH: 33.8 pg (ref 26.0–34.0)
MCHC: 32.5 g/dL (ref 30.0–36.0)
MCV: 104.1 fL — ABNORMAL HIGH (ref 80.0–100.0)
Platelets: 311 10*3/uL (ref 150–400)
RBC: 3.4 MIL/uL — ABNORMAL LOW (ref 3.87–5.11)
RDW: 12.4 % (ref 11.5–15.5)
WBC: 12 10*3/uL — ABNORMAL HIGH (ref 4.0–10.5)
nRBC: 0 % (ref 0.0–0.2)

## 2023-05-24 LAB — CREATININE, SERUM
Creatinine, Ser: 0.5 mg/dL (ref 0.44–1.00)
GFR, Estimated: >60 mL/min (ref >60)

## 2023-05-24 SURGERY — ARTHROPLASTY, SHOULDER, TOTAL, REVERSE
Anesthesia: General | Site: Shoulder | Laterality: Left

## 2023-05-24 MED ORDER — VANCOMYCIN HCL 1000 MG IV SOLR
INTRAVENOUS | Status: DC | PRN
  Administered 2023-05-24: 1000 mg via TOPICAL

## 2023-05-24 MED ORDER — BUPIVACAINE LIPOSOME 1.3 % IJ SUSP
INTRAMUSCULAR | Status: DC | PRN
  Administered 2023-05-24: 10 mL via PERINEURAL

## 2023-05-24 MED ORDER — METHOCARBAMOL 1000 MG/10ML IJ SOLN: 500.0000 mg | Freq: Four times a day (QID) | INTRAVENOUS | Status: DC | PRN

## 2023-05-24 MED ORDER — OXYCODONE HCL 5 MG PO TABS
5.0000 mg | ORAL_TABLET | ORAL | Status: DC | PRN
  Administered 2023-05-24 – 2023-05-26 (×5): 10 mg via ORAL
  Filled 2023-05-24 (×5): qty 2

## 2023-05-24 MED ORDER — ENOXAPARIN SODIUM 40 MG/0.4ML IJ SOSY
40.0000 mg | PREFILLED_SYRINGE | INTRAMUSCULAR | Status: DC
  Administered 2023-05-25 – 2023-05-26 (×2): 40 mg via SUBCUTANEOUS
  Filled 2023-05-24 (×2): qty 0.4

## 2023-05-24 MED ORDER — DOCUSATE SODIUM 100 MG PO CAPS
100.0000 mg | ORAL_CAPSULE | Freq: Two times a day (BID) | ORAL | Status: DC
  Administered 2023-05-24 – 2023-05-26 (×4): 100 mg via ORAL
  Filled 2023-05-24 (×4): qty 1

## 2023-05-24 MED ORDER — ONDANSETRON HCL 4 MG PO TABS: 4.0000 mg | ORAL_TABLET | Freq: Four times a day (QID) | ORAL | Status: DC | PRN

## 2023-05-24 MED ORDER — DEXAMETHASONE SODIUM PHOSPHATE 10 MG/ML IJ SOLN
INTRAMUSCULAR | Status: AC
  Filled 2023-05-24: qty 1

## 2023-05-24 MED ORDER — CHLORHEXIDINE GLUCONATE 4 % EX SOLN: 1.0000 | CUTANEOUS | 1 refills | Status: AC

## 2023-05-24 MED ORDER — FENTANYL CITRATE (PF) 100 MCG/2ML IJ SOLN
INTRAMUSCULAR | Status: AC
  Filled 2023-05-24: qty 2

## 2023-05-24 MED ORDER — PROPOFOL 10 MG/ML IV BOLUS
INTRAVENOUS | Status: DC | PRN
Start: 2023-05-24 — End: 2023-05-24
  Administered 2023-05-24: 100 mg via INTRAVENOUS
  Administered 2023-05-24: 50 mg via INTRAVENOUS

## 2023-05-24 MED ORDER — DEXAMETHASONE SODIUM PHOSPHATE 10 MG/ML IJ SOLN
INTRAMUSCULAR | Status: DC | PRN
  Administered 2023-05-24: 8 mg via INTRAVENOUS

## 2023-05-24 MED ORDER — MUPIROCIN 2 % EX OINT: 1.0000 | TOPICAL_OINTMENT | Freq: Two times a day (BID) | CUTANEOUS | 0 refills | Status: AC

## 2023-05-24 MED ORDER — DULOXETINE HCL 60 MG PO CPEP
60.0000 mg | ORAL_CAPSULE | Freq: Two times a day (BID) | ORAL | Status: DC
  Administered 2023-05-24 – 2023-05-26 (×4): 60 mg via ORAL
  Filled 2023-05-24 (×4): qty 1

## 2023-05-24 MED ORDER — NORTRIPTYLINE HCL 25 MG PO CAPS
50.0000 mg | ORAL_CAPSULE | Freq: Every day | ORAL | Status: DC
  Administered 2023-05-24 – 2023-05-25 (×2): 50 mg via ORAL
  Filled 2023-05-24 (×2): qty 2

## 2023-05-24 MED ORDER — TRANEXAMIC ACID-NACL 1000-0.7 MG/100ML-% IV SOLN
1000.0000 mg | INTRAVENOUS | Status: AC
  Administered 2023-05-24: 1000 mg via INTRAVENOUS
  Filled 2023-05-24: qty 100

## 2023-05-24 MED ORDER — BUDESONIDE 0.25 MG/2ML IN SUSP: 0.2500 mg | Freq: Four times a day (QID) | RESPIRATORY_TRACT | Status: DC | PRN

## 2023-05-24 MED ORDER — IPRATROPIUM-ALBUTEROL 20-100 MCG/ACT IN AERS: 1.0000 | INHALATION_SPRAY | Freq: Four times a day (QID) | RESPIRATORY_TRACT | Status: DC | PRN

## 2023-05-24 MED ORDER — METOCLOPRAMIDE HCL 5 MG/ML IJ SOLN: 5.0000 mg | Freq: Three times a day (TID) | INTRAMUSCULAR | Status: DC | PRN

## 2023-05-24 MED ORDER — ACETAMINOPHEN 500 MG PO TABS
1000.0000 mg | ORAL_TABLET | Freq: Once | ORAL | Status: AC
  Administered 2023-05-24: 1000 mg via ORAL
  Filled 2023-05-24: qty 2

## 2023-05-24 MED ORDER — OXYCODONE-ACETAMINOPHEN 10-325 MG PO TABS
1.0000 | ORAL_TABLET | Freq: Four times a day (QID) | ORAL | 0 refills | Status: AC | PRN
Start: 2023-05-24 — End: 2024-05-23

## 2023-05-24 MED ORDER — HYDROMORPHONE HCL 1 MG/ML IJ SOLN
INTRAMUSCULAR | Status: DC | PRN
  Administered 2023-05-24: 1 mg via INTRAVENOUS

## 2023-05-24 MED ORDER — FENTANYL CITRATE (PF) 100 MCG/2ML IJ SOLN
INTRAMUSCULAR | Status: DC | PRN
  Administered 2023-05-24 (×4): 50 ug via INTRAVENOUS

## 2023-05-24 MED ORDER — ACETAMINOPHEN 325 MG PO TABS
325.0000 mg | ORAL_TABLET | Freq: Four times a day (QID) | ORAL | Status: DC | PRN
  Administered 2023-05-26: 650 mg via ORAL
  Filled 2023-05-24: qty 2

## 2023-05-24 MED ORDER — HYDROMORPHONE HCL 1 MG/ML IJ SOLN: 0.5000 mg | INTRAMUSCULAR | Status: DC | PRN

## 2023-05-24 MED ORDER — CEFAZOLIN SODIUM-DEXTROSE 2-4 GM/100ML-% IV SOLN
2.0000 g | INTRAVENOUS | Status: AC
  Administered 2023-05-24: 2 g via INTRAVENOUS
  Filled 2023-05-24: qty 100

## 2023-05-24 MED ORDER — CELECOXIB 200 MG PO CAPS
200.0000 mg | ORAL_CAPSULE | Freq: Every day | ORAL | Status: DC
  Administered 2023-05-24 – 2023-05-25 (×2): 200 mg via ORAL
  Filled 2023-05-24 (×2): qty 1

## 2023-05-24 MED ORDER — TRANEXAMIC ACID-NACL 1000-0.7 MG/100ML-% IV SOLN
1000.0000 mg | Freq: Once | INTRAVENOUS | Status: AC
  Administered 2023-05-24: 1000 mg via INTRAVENOUS
  Filled 2023-05-24: qty 100

## 2023-05-24 MED ORDER — LIDOCAINE HCL (CARDIAC) PF 100 MG/5ML IV SOSY
PREFILLED_SYRINGE | INTRAVENOUS | Status: DC | PRN
  Administered 2023-05-24: 60 mg via INTRAVENOUS

## 2023-05-24 MED ORDER — LACTATED RINGERS IV SOLN: INTRAVENOUS | Status: DC

## 2023-05-24 MED ORDER — ONDANSETRON HCL 4 MG/2ML IJ SOLN: 4.0000 mg | Freq: Four times a day (QID) | INTRAMUSCULAR | Status: DC | PRN

## 2023-05-24 MED ORDER — METOCLOPRAMIDE HCL 5 MG PO TABS: 5.0000 mg | ORAL_TABLET | Freq: Three times a day (TID) | ORAL | Status: DC | PRN

## 2023-05-24 MED ORDER — MENTHOL 3 MG MT LOZG: 1.0000 | LOZENGE | OROMUCOSAL | Status: DC | PRN

## 2023-05-24 MED ORDER — ACETAMINOPHEN 500 MG PO TABS
1000.0000 mg | ORAL_TABLET | Freq: Four times a day (QID) | ORAL | Status: AC
  Administered 2023-05-24 – 2023-05-25 (×3): 1000 mg via ORAL
  Filled 2023-05-24 (×3): qty 2

## 2023-05-24 MED ORDER — FENTANYL CITRATE PF 50 MCG/ML IJ SOSY: 25.0000 ug | PREFILLED_SYRINGE | INTRAMUSCULAR | Status: DC | PRN

## 2023-05-24 MED ORDER — MIDAZOLAM HCL 2 MG/2ML IJ SOLN
INTRAMUSCULAR | Status: AC
  Filled 2023-05-24: qty 2

## 2023-05-24 MED ORDER — PROPOFOL 10 MG/ML IV BOLUS
INTRAVENOUS | Status: AC
  Filled 2023-05-24: qty 20

## 2023-05-24 MED ORDER — ALBUMIN HUMAN 5 % IV SOLN: INTRAVENOUS | Status: DC | PRN

## 2023-05-24 MED ORDER — HYDROMORPHONE HCL 2 MG/ML IJ SOLN
INTRAMUSCULAR | Status: AC
  Filled 2023-05-24: qty 1

## 2023-05-24 MED ORDER — BUPIVACAINE HCL (PF) 0.5 % IJ SOLN
INTRAMUSCULAR | Status: DC | PRN
  Administered 2023-05-24: 10 mL via PERINEURAL

## 2023-05-24 MED ORDER — CALCIUM CARBONATE 1250 (500 CA) MG PO TABS
500.0000 mg | ORAL_TABLET | Freq: Every day | ORAL | Status: DC
  Administered 2023-05-25 – 2023-05-26 (×2): 1250 mg via ORAL
  Filled 2023-05-24 (×2): qty 1

## 2023-05-24 MED ORDER — ONDANSETRON HCL 4 MG/2ML IJ SOLN
INTRAMUSCULAR | Status: AC
  Filled 2023-05-24: qty 2

## 2023-05-24 MED ORDER — PHENYLEPHRINE HCL-NACL 20-0.9 MG/250ML-% IV SOLN
INTRAVENOUS | Status: DC | PRN
  Administered 2023-05-24: 20 ug/min via INTRAVENOUS

## 2023-05-24 MED ORDER — METHOCARBAMOL 500 MG PO TABS
500.0000 mg | ORAL_TABLET | Freq: Four times a day (QID) | ORAL | Status: DC | PRN
  Administered 2023-05-24 – 2023-05-26 (×7): 500 mg via ORAL
  Filled 2023-05-24 (×7): qty 1

## 2023-05-24 MED ORDER — AMISULPRIDE (ANTIEMETIC) 5 MG/2ML IV SOLN: 10.0000 mg | Freq: Once | INTRAVENOUS | Status: DC | PRN

## 2023-05-24 MED ORDER — PHENYLEPHRINE HCL-NACL 20-0.9 MG/250ML-% IV SOLN
INTRAVENOUS | Status: AC
  Filled 2023-05-24: qty 250

## 2023-05-24 MED ORDER — OXYCODONE HCL 5 MG PO TABS
10.0000 mg | ORAL_TABLET | ORAL | Status: DC | PRN
  Administered 2023-05-24: 15 mg via ORAL
  Administered 2023-05-25 – 2023-05-26 (×2): 10 mg via ORAL
  Filled 2023-05-24 (×2): qty 2
  Filled 2023-05-24: qty 3

## 2023-05-24 MED ORDER — TRAZODONE HCL 100 MG PO TABS
300.0000 mg | ORAL_TABLET | Freq: Every day | ORAL | Status: DC
  Administered 2023-05-24 – 2023-05-25 (×2): 300 mg via ORAL
  Filled 2023-05-24 (×2): qty 3

## 2023-05-24 MED ORDER — VANCOMYCIN HCL IN DEXTROSE 1-5 GM/200ML-% IV SOLN
1000.0000 mg | INTRAVENOUS | Status: AC
  Administered 2023-05-24: 1000 mg via INTRAVENOUS
  Filled 2023-05-24: qty 200

## 2023-05-24 MED ORDER — ORAL CARE MOUTH RINSE: 15.0000 mL | Freq: Once | OROMUCOSAL | Status: AC

## 2023-05-24 MED ORDER — LIDOCAINE HCL (PF) 2 % IJ SOLN
INTRAMUSCULAR | Status: AC
  Filled 2023-05-24: qty 5

## 2023-05-24 MED ORDER — VITAMIN D 25 MCG (1000 UNIT) PO TABS
5000.0000 [IU] | ORAL_TABLET | Freq: Every day | ORAL | Status: DC
  Administered 2023-05-25 – 2023-05-26 (×2): 5000 [IU] via ORAL
  Filled 2023-05-24 (×2): qty 5

## 2023-05-24 MED ORDER — ROCURONIUM BROMIDE 100 MG/10ML IV SOLN
INTRAVENOUS | Status: DC | PRN
  Administered 2023-05-24: 50 mg via INTRAVENOUS

## 2023-05-24 MED ORDER — VANCOMYCIN HCL 1000 MG IV SOLR
INTRAVENOUS | Status: AC
  Filled 2023-05-24: qty 20

## 2023-05-24 MED ORDER — MIDAZOLAM HCL 5 MG/5ML IJ SOLN
INTRAMUSCULAR | Status: DC | PRN
  Administered 2023-05-24: 2 mg via INTRAVENOUS

## 2023-05-24 MED ORDER — VITAMIN B-12 1000 MCG PO TABS
3000.0000 ug | ORAL_TABLET | Freq: Every day | ORAL | Status: DC
  Administered 2023-05-25 – 2023-05-26 (×2): 3000 ug via ORAL
  Filled 2023-05-24 (×2): qty 3

## 2023-05-24 MED ORDER — CHLORHEXIDINE GLUCONATE 0.12 % MT SOLN
15.0000 mL | Freq: Once | OROMUCOSAL | Status: AC
  Administered 2023-05-24: 15 mL via OROMUCOSAL

## 2023-05-24 MED ORDER — PHENOL 1.4 % MT LIQD: 1.0000 | OROMUCOSAL | Status: DC | PRN

## 2023-05-24 MED ORDER — ROCURONIUM BROMIDE 10 MG/ML (PF) SYRINGE
PREFILLED_SYRINGE | INTRAVENOUS | Status: AC
  Filled 2023-05-24: qty 10

## 2023-05-24 MED ORDER — 0.9 % SODIUM CHLORIDE (POUR BTL) OPTIME
TOPICAL | Status: DC | PRN
  Administered 2023-05-24: 1000 mL

## 2023-05-24 MED ORDER — CEFAZOLIN SODIUM-DEXTROSE 1-4 GM/50ML-% IV SOLN
1.0000 g | Freq: Four times a day (QID) | INTRAVENOUS | Status: AC
  Administered 2023-05-24 – 2023-05-25 (×3): 1 g via INTRAVENOUS
  Filled 2023-05-24 (×3): qty 50

## 2023-05-24 MED ORDER — PHENYLEPHRINE 80 MCG/ML (10ML) SYRINGE FOR IV PUSH (FOR BLOOD PRESSURE SUPPORT)
PREFILLED_SYRINGE | INTRAVENOUS | Status: AC
  Filled 2023-05-24: qty 10

## 2023-05-24 MED ORDER — ONDANSETRON HCL 4 MG PO TABS: 4.0000 mg | ORAL_TABLET | Freq: Three times a day (TID) | ORAL | 0 refills | Status: AC | PRN

## 2023-05-24 MED ORDER — LEVOTHYROXINE SODIUM 112 MCG PO TABS: 112.0000 ug | ORAL_TABLET | ORAL | Status: DC

## 2023-05-24 MED ORDER — IPRATROPIUM-ALBUTEROL 0.5-2.5 (3) MG/3ML IN SOLN: 3.0000 mL | Freq: Four times a day (QID) | RESPIRATORY_TRACT | Status: DC | PRN

## 2023-05-24 MED ORDER — SUGAMMADEX SODIUM 200 MG/2ML IV SOLN
INTRAVENOUS | Status: DC | PRN
  Administered 2023-05-24: 200 mg via INTRAVENOUS

## 2023-05-24 MED ORDER — FUROSEMIDE 20 MG PO TABS: 20.0000 mg | ORAL_TABLET | Freq: Every day | ORAL | Status: DC | PRN

## 2023-05-24 SURGICAL SUPPLY — 76 items
AID PSTN UNV HD RSTRNT DISP (MISCELLANEOUS)
BAG COUNTER SPONGE SURGICOUNT (BAG) IMPLANT
BAG SPEC THK2 15X12 ZIP CLS (MISCELLANEOUS) ×1
BAG SPNG CNTER NS LX DISP (BAG)
BAG ZIPLOCK 12X15 (MISCELLANEOUS) ×1 IMPLANT
BEARING HUMERAL SHLDER 36M STD (Shoulder) IMPLANT
BIT DRILL 2.7 W/STOP DISP (BIT) IMPLANT
BIT DRILL TWIST 2.7 (BIT) IMPLANT
BLADE SAG 18X100X1.27 (BLADE) ×1 IMPLANT
BRNG HUM STD 36 RVRS SHLDR (Shoulder) ×1 IMPLANT
BSPLAT GLND LRG AUG TPR ADPR (Joint) ×1 IMPLANT
CLSR STERI-STRIP ANTIMIC 1/2X4 (GAUZE/BANDAGES/DRESSINGS) IMPLANT
COMP REV AUG LG W/TAPER/GLENOI (Joint) ×1 IMPLANT
COMPONENT RV AUG LG W/TAPR/GLN (Joint) IMPLANT
COOLER ICEMAN CLASSIC (MISCELLANEOUS) ×1 IMPLANT
COVER BACK TABLE 60X90IN (DRAPES) ×1 IMPLANT
COVER SURGICAL LIGHT HANDLE (MISCELLANEOUS) ×1 IMPLANT
DRAPE ORTHO SPLIT 77X108 STRL (DRAPES) ×2
DRAPE SHEET LG 3/4 BI-LAMINATE (DRAPES) ×1 IMPLANT
DRAPE SURG 17X11 SM STRL (DRAPES) ×1 IMPLANT
DRAPE SURG ORHT 6 SPLT 77X108 (DRAPES) ×2 IMPLANT
DRAPE TOP 10253 STERILE (DRAPES) ×1 IMPLANT
DRAPE U-SHAPE 47X51 STRL (DRAPES) ×1 IMPLANT
DRSG AQUACEL AG ADV 3.5X 6 (GAUZE/BANDAGES/DRESSINGS) IMPLANT
DRSG AQUACEL AG ADV 3.5X10 (GAUZE/BANDAGES/DRESSINGS) ×1 IMPLANT
DURAPREP 26ML APPLICATOR (WOUND CARE) IMPLANT
ELECT REM PT RETURN 15FT ADLT (MISCELLANEOUS) ×1 IMPLANT
FACESHIELD WRAPAROUND (MASK) ×1 IMPLANT
FACESHIELD WRAPAROUND OR TEAM (MASK) ×1 IMPLANT
GLENOID SPHERE 36MM CVD +3 (Orthopedic Implant) IMPLANT
GLOVE BIO SURGEON STRL SZ7.5 (GLOVE) ×4 IMPLANT
GLOVE BIOGEL PI IND STRL 8 (GLOVE) ×2 IMPLANT
GOWN STRL REUS W/ TWL XL LVL3 (GOWN DISPOSABLE) ×2 IMPLANT
GOWN STRL REUS W/TWL XL LVL3 (GOWN DISPOSABLE) ×2
GUIDE RSA SHLD BM ROT L SU (ORTHOPEDIC DISPOSABLE SUPPLIES) IMPLANT
KIT BASIN OR (CUSTOM PROCEDURE TRAY) ×1 IMPLANT
KIT TURNOVER KIT A (KITS) IMPLANT
MANIFOLD NEPTUNE II (INSTRUMENTS) ×1 IMPLANT
NDL TAPERED W/ NITINOL LOOP (MISCELLANEOUS) IMPLANT
NEEDLE TAPERED W/ NITINOL LOOP (MISCELLANEOUS) IMPLANT
NS IRRIG 1000ML POUR BTL (IV SOLUTION) ×1 IMPLANT
PACK SHOULDER (CUSTOM PROCEDURE TRAY) ×1 IMPLANT
PAD CAST 4YDX4 CTTN HI CHSV (CAST SUPPLIES) ×1 IMPLANT
PAD COLD SHLDR WRAP-ON (PAD) ×1 IMPLANT
PADDING CAST COTTON 4X4 STRL (CAST SUPPLIES) ×1
PIN THREADED REVERSE (PIN) IMPLANT
REAMER GUIDE BUSHING SURG DISP (MISCELLANEOUS) IMPLANT
REAMER GUIDE MINI 30 ZI (ORTHOPEDIC DISPOSABLE SUPPLIES) IMPLANT
REAMER GUIDE W/SCREW AUG (MISCELLANEOUS) IMPLANT
RESTRAINT HEAD UNIVERSAL NS (MISCELLANEOUS) IMPLANT
SCREW BONE STRL 6.5MMX30MM (Screw) IMPLANT
SCREW LOCKING 4.75MMX15MM (Screw) IMPLANT
SCREW LOCKING NS 4.75MMX20MM (Screw) IMPLANT
SCREW LOCKING STRL 4.75X25X3.5 (Screw) IMPLANT
SHOULDER HUMERAL BEAR 36M STD (Shoulder) ×1 IMPLANT
SLING ARM FOAM STRAP MED (SOFTGOODS) IMPLANT
SMARTMIX MINI TOWER (MISCELLANEOUS)
SPONGE T-LAP 4X18 ~~LOC~~+RFID (SPONGE) IMPLANT
STEM HUM STANDARD SIZE 5 (Screw) ×1 IMPLANT
STEM HUM STD SIZE 5 (Screw) IMPLANT
STRIP CLOSURE SKIN 1/2X4 (GAUZE/BANDAGES/DRESSINGS) ×1 IMPLANT
SUCTION TUBE FRAZIER 10FR DISP (SUCTIONS) ×1 IMPLANT
SUT FIBERWIRE #2 38 T-5 BLUE (SUTURE)
SUT MAXBRAID #2 CVD NDL (SUTURE) IMPLANT
SUT MNCRL AB 3-0 PS2 18 (SUTURE) ×1 IMPLANT
SUT VIC AB 0 CT1 36 (SUTURE) ×1 IMPLANT
SUT VIC AB 1 CT1 36 (SUTURE) ×1 IMPLANT
SUT VIC AB 2-0 CT1 27 (SUTURE) ×1
SUT VIC AB 2-0 CT1 TAPERPNT 27 (SUTURE) ×1 IMPLANT
SUTURE FIBERWR #2 38 T-5 BLUE (SUTURE) IMPLANT
SUTURE TAPE 1.3 40 TPR END (SUTURE) ×1 IMPLANT
SUTURETAPE 1.3 40 TPR END (SUTURE) ×1
TOWEL OR 17X26 10 PK STRL BLUE (TOWEL DISPOSABLE) ×1 IMPLANT
TOWER SMARTMIX MINI (MISCELLANEOUS) IMPLANT
TRAY HUMERAL NEUTRAL EXT 6 (Shoulder) IMPLANT
TUBE SUCTION HIGH CAP CLEAR NV (SUCTIONS) ×1 IMPLANT

## 2023-05-24 NOTE — Op Note (Signed)
05/24/2023  9:11 AM  PATIENT:  Melissa Burke    PRE-OPERATIVE DIAGNOSIS:  Left shoulder malunion of humerus  POST-OPERATIVE DIAGNOSIS:  Same  PROCEDURE:  Left REVERSE SHOULDER ARTHROPLASTY  SURGEON:  Yolonda Kida, MD  ASSISTANT: Dion Saucier PA-C  Assistant attestation: PA Mcclung present for the entire procedure.  ANESTHESIA:   General  ESTIMATED BLOOD LOSS: 100 cc  PREOPERATIVE INDICATIONS:  Melissa Burke is a  72 y.o. female with a diagnosis of Left shoulder malunion of humerus who failed conservative measures and elected for surgical management.    The risks benefits and alternatives were discussed with the patient preoperatively including but not limited to the risks of infection, bleeding, nerve injury, cardiopulmonary complications, the need for revision surgery, dislocation, brachial plexus palsy, incomplete relief of pain, among others, and the patient was willing to proceed.  OPERATIVE IMPLANTS:  [employer] reverse arthroplasty system Comprehensive glenoid large augment oriented at the 1 o'clock position with a 30 mm central screw and 4 peripheral locking screws. 36 mm +3 lateralized glenosphere  Zimmer Identity size 5 stem with a -6 humeral tray and a standard polyethylene humeral liner  OPERATIVE FINDINGS:  Malunited proximal humerus fracture with significant valgus collapse of the humeral head flipped about 90 degrees off axis.  Moderate heterotropic ossification within the posterior rotator cuff musculature as well as the lesser tuberosity and the subscapularis.  Significant superior wear pattern noted on the glenoid as well.  Long head of biceps tendon was absent from the shoulder.  OPERATIVE PROCEDURE: The patient was brought to the operating room and placed in the supine position. General anesthesia was administered. IV antibiotics were given. A Foley was not placed. Time out was performed. The upper extremity was prepped and draped in usual  sterile fashion. The patient was in a beachchair position. Deltopectoral approach was carried out. The subscapularis was released off of the bone.  At this time we did encounter significant heterotopic ossification within the lesser tuberosity fracture and subsequently into the subscapularis tendon.  We excised the majority of this working from superior to inferior as we gained access to the glenohumeral joint.  I then performed circumferential releases of the humerus, and then dislocated the head, and then reamed with the reamer to the above named size.  I then applied the jig, and cut the humeral head in 30 of retroversion, and then turned my attention to the glenoid.  Deep retractors were placed, and I resected the labrum, and then placed a guidepin into the center position on the glenoid, with slight inferior inclination. I then reamed over the guidepin, and this created a small metaphyseal cancellus blush inferiorly, removing just the cartilage to the subchondral bone superiorly.  We then used the secondary reamer to prepare for a large augmented glenoid baseplate oriented at about the 1:30 position on this left shoulder glenoid.  The base plate was selected and impacted place, and then I secured it centrally with a nonlocking screw, and I had excellent purchase both inferiorly and superiorly. I placed a short locking screws on anterior and posterior aspects.  I then turned my attention to the glenosphere, and impacted this into place, placing slight inferior offset (set on A).   The glenoid sphere was completely seated, and had engagement of the [facility] taper. I then turned my attention back to the humerus.  I sequentially broached, and then trialed, and was found to restore soft tissue tension, and it had 2 finger tightness. Therefore the above named  components were selected. The shoulder felt stable throughout functional motion.  Before I placed the real prosthesis I had also placed a  #2  FiberWire through drill holes in the humerus for later subscapularis repair.  I then impacted the real prosthesis into place, as well as the real humeral tray, and reduced the shoulder. The shoulder had excellent motion, and was stable, and I irrigated the wounds copiously.    I then used these to repair the subscapularis. This came down to bone.  I then irrigated the shoulder copiously once more, repaired the deltopectoral interval, and placed 1 g of vancomycin powder into the wound.  With #2 FiberWire followed by subcutaneous Vicryl, then monocryl for the skin,  with Steri-Strips and sterile gauze for the skin. The patient was awakened and returned back in stable and satisfactory condition. There no complications and they tolerated the procedure well.  All counts were correct x2.  Disposition:  Melissa Burke will be admitted to the orthopedic service postoperatively for routine postoperative care including occupational therapy and pain control.  Likely discharge home tomorrow.  She will follow-up with me in the office in 2 weeks with 2 x-ray views of the left shoulder AP, scapular Y.

## 2023-05-24 NOTE — Discharge Instructions (Signed)
Orthopedic surgery discharge instructions:  -Maintain postoperative bandage until follow-up appointment.  This is waterproof, and you may begin showering on postoperative day #3.  Do not submerge underwater.  Maintain that bandage until your follow-up appointment in 2 weeks.  -No lifting over 2 pounds with operateive arm.  You may use the arm immediately for activities of daily living such as bathing, washing your face and brushing your teeth, eating, and getting dressed.  Otherwise maintain your sling when you are out of the house and sleeping.  -Apply ice liberally to the shoulder throughout the day.  For mild to moderate pain use Tylenol and Advil as needed around-the-clock.  For breakthrough pain use oxycodone as necessary.  -You will return to see Dr. Rogers in the office in 2 weeks for routine postoperative check with x-rays.  

## 2023-05-24 NOTE — Transfer of Care (Signed)
Immediate Anesthesia Transfer of Care Note  Patient: Melissa Burke  Procedure(s) Performed: REVERSE SHOULDER ARTHROPLASTY (Left: Shoulder)  Patient Location: PACU  Anesthesia Type:GA combined with regional for post-op pain  Level of Consciousness: awake, alert , oriented, and patient cooperative  Airway & Oxygen Therapy: Patient Spontanous Breathing and Patient connected to face mask oxygen  Post-op Assessment: Report given to RN and Post -op Vital signs reviewed and stable  Post vital signs: Reviewed and stable  Last Vitals:  Vitals Value Taken Time  BP 166/84 05/24/23 0934  Temp    Pulse 107 05/24/23 0938  Resp 24 05/24/23 0938  SpO2 100 % 05/24/23 0938  Vitals shown include unfiled device data.  Last Pain:  Vitals:   05/24/23 0544  TempSrc: Oral         Complications: No notable events documented.

## 2023-05-24 NOTE — Anesthesia Postprocedure Evaluation (Signed)
Anesthesia Post Note  Patient: Melissa Burke  Procedure(s) Performed: REVERSE SHOULDER ARTHROPLASTY (Left: Shoulder)     Patient location during evaluation: PACU Anesthesia Type: General Level of consciousness: sedated and patient cooperative Pain management: pain level controlled Vital Signs Assessment: post-procedure vital signs reviewed and stable Respiratory status: spontaneous breathing Cardiovascular status: stable Anesthetic complications: no   No notable events documented.  Last Vitals:  Vitals:   05/24/23 1040 05/24/23 1406  BP: 134/72 (!) 152/83  Pulse: (!) 104 (!) 109  Resp: 18 18  Temp: 36.4 C 36.6 C  SpO2: 98% 98%    Last Pain:  Vitals:   05/24/23 1751  TempSrc:   PainSc: 5                  Lewie Loron

## 2023-05-24 NOTE — H&P (Signed)
ORTHOPAEDIC H and P REQUESTING PHYSICIAN: Yolonda Kida, MD  PCP:  System, Provider Not In  Chief Complaint: Oleft proximal humerus nonunion  HPI: Melissa Burke is a 72 y.o. female who complains of Left shoulder pain and stiffness.  She is doing pretty well following a proximal humerus fracture but has gone on to develop avascular necrosis and stiffness.  Here today for reverse arthroplasty.  Past Medical History:  Diagnosis Date   Asthma    Avascular necrosis (HCC)    Cancer (HCC)    Nasal cancer   Chronic pain syndrome    Chronic respiratory failure (HCC)    Compression fracture of T12 vertebra (HCC)    Hypertension    Hypothyroidism    Osteoarthritis    Osteopenia    PTSD (post-traumatic stress disorder)    Restless leg syndrome    Severe anemia    Sleep apnea    Past Surgical History:  Procedure Laterality Date   ABDOMINAL HYSTERECTOMY     ANKLE FUSION Left 02/2009   APPENDECTOMY     BLADDER REPAIR     BREAST SURGERY     CATARACT EXTRACTION, BILATERAL     FRACTURE SURGERY     IR VERTEBROPLASTY CERV/THOR BX INC UNI/BIL INC/INJECT/IMAGING  03/19/2019   SPINAL CORD STIMULATOR IMPLANT     TONSILLECTOMY     VENOUS ABLATION Bilateral    WRIST SURGERY  08/2013   Social History   Socioeconomic History   Marital status: Single    Spouse name: Not on file   Number of children: Not on file   Years of education: Not on file   Highest education level: Not on file  Occupational History   Not on file  Tobacco Use   Smoking status: Never   Smokeless tobacco: Never  Vaping Use   Vaping status: Never Used  Substance and Sexual Activity   Alcohol use: Never   Drug use: Never   Sexual activity: Not on file  Other Topics Concern   Not on file  Social History Narrative   Not on file   Social Determinants of Health   Financial Resource Strain: Not on file  Food Insecurity: Not on file  Transportation Needs: Not on file  Physical Activity: Not on  file  Stress: Not on file  Social Connections: Not on file   Family History  Problem Relation Age of Onset   Emphysema Mother    Cancer Sister    Heart attack Other    Colon cancer Other    Allergies  Allergen Reactions   Erythromycin Diarrhea   Other Other (See Comments)    Metal - breakout   Prior to Admission medications   Medication Sig Start Date End Date Taking? Authorizing Provider  alendronate (FOSAMAX) 70 MG tablet Take 70 mg by mouth once a week. Take with a full glass of water on an empty stomach in the morning   Yes [provider]  budesonide (PULMICORT) 0.25 MG/2ML nebulizer solution Take 2 mLs (0.25 mg total) by nebulization 2 (two) times daily. Patient taking differently: Take 0.25 mg by nebulization every 6 (six) hours as needed (shortness of breath). 06/19/20  Yes Leatha Gilding, MD  calcium carbonate (OSCAL) 1500 (600 Ca) MG TABS tablet Take 600 mg of elemental calcium by mouth daily.   Yes [provider]  celecoxib (CELEBREX) 200 MG capsule Take 200 mg by mouth at bedtime.  11/07/16  Yes [provider]  Cholecalciferol (VITAMIN D3) 125  MCG (5000 UT) CAPS Take 5,000 Units by mouth in the morning.   Yes [provider]  DULoxetine (CYMBALTA) 60 MG capsule Take 60 mg by mouth 2 (two) times daily.  12/20/16  Yes [provider]  furosemide (LASIX) 20 MG tablet Take 20 mg by mouth daily as needed for fluid or edema.   Yes [provider]  HYDROcodone-acetaminophen (NORCO) 10-325 MG tablet Take 1 tablet by mouth 4 (four) times daily as needed for moderate pain. Patient taking differently: Take 1 tablet by mouth every 6 (six) hours as needed for moderate pain. 06/19/20  Yes Leatha Gilding, MD  Ipratropium-Albuterol (COMBIVENT) 20-100 MCG/ACT AERS respimat Take 1 puff by mouth every 6 (six) hours as needed for wheezing or shortness of breath.  12/05/16  Yes [provider]  levothyroxine (SYNTHROID) 112 MCG  tablet Take 112 mcg by mouth every Monday, Wednesday, and Friday. 12/21/16  Yes [provider]  loratadine (CLARITIN) 10 MG tablet Take 10 mg by mouth in the morning and at bedtime.   Yes [provider]  methocarbamol (ROBAXIN) 500 MG tablet Take 500 mg by mouth in the morning and at bedtime.   Yes [provider]  nortriptyline (PAMELOR) 50 MG capsule Take 50 mg by mouth at bedtime.   Yes [provider]  traZODone (DESYREL) 100 MG tablet Take 300 mg by mouth at bedtime.  12/20/16  Yes [provider]  vitamin B-12 (CYANOCOBALAMIN) 1000 MCG tablet Take 3,000 mcg by mouth in the morning.   Yes [provider]  metFORMIN (GLUCOPHAGE) 500 MG tablet Take 1 tablet (500 mg total) by mouth 2 (two) times daily with a meal. Patient not taking: Reported on 10/06/2020 06/19/20 06/19/21  Leatha Gilding, MD   No results found.  Positive ROS: All other systems have been reviewed and were otherwise negative with the exception of those mentioned in the HPI and as above.  Physical Exam: General: Alert, no acute distress Cardiovascular: No pedal edema Respiratory: No cyanosis, no use of accessory musculature GI: No organomegaly, abdomen is soft and non-tender Skin: No lesions in the area of chief complaint Neurologic: Sensation intact distally Psychiatric: Patient is competent for consent with normal mood and affect Lymphatic: No axillary or cervical lymphadenopathy  MUSCULOSKELETAL:  Left upper extremity is warm and well-perfused no open wounds or lesions.  Neurovascular intact throughout.  Assessment: Left proximal humerus avascular necrosis   Plan: -Plan is to proceed today with reverse arthroplasty of the left shoulder.  This will be something that should address the AVN as well as pain and stiffness.  We discussed the risk of bleeding, infection, damage to surrounding nerves and vessels, persistent pain and stiffness as well as potential risk for  dislocation fracture.  We also discussed the risk of anesthesia and she has provided informed consent.  - DC home post op    Yolonda Kida, MD Cell [phone]    05/24/2023 6:36 AM

## 2023-05-24 NOTE — Brief Op Note (Signed)
05/24/2023  9:10 AM  PATIENT:  Melissa Burke  72 y.o. female  PRE-OPERATIVE DIAGNOSIS:  Left shoulder malunion of humerus  POST-OPERATIVE DIAGNOSIS:  Left shoulder malunion of humerus  PROCEDURE:  Procedure(s) with comments: REVERSE SHOULDER ARTHROPLASTY (Left) - 130  SURGEON:  Surgeons and Role:    Aundria Rud, Noah Delaine, MD - Primary  PHYSICIAN ASSISTANT: Dion Saucier, PA-C   ANESTHESIA:   regional and general  EBL:  100 mL   BLOOD ADMINISTERED:none  DRAINS: none   LOCAL MEDICATIONS USED:  NONE  SPECIMEN:  No Specimen  DISPOSITION OF SPECIMEN:  N/A  COUNTS:  YES  TOURNIQUET:  * No tourniquets in log *  DICTATION: .Note written in EPIC  PLAN OF CARE: Admit for overnight observation  PATIENT DISPOSITION:  PACU - hemodynamically stable.   Delay start of Pharmacological VTE agent (>24hrs) due to surgical blood loss or risk of bleeding: not applicable

## 2023-05-24 NOTE — Anesthesia Procedure Notes (Signed)
Anesthesia Regional Block: Interscalene brachial plexus block   Pre-Anesthetic Checklist: , timeout performed,  Correct Patient, Correct Site, Correct Laterality,  Correct Procedure, Correct Position, site marked,  Risks and benefits discussed,  Surgical consent,  Pre-op evaluation,  At surgeon's request and post-op pain management  Laterality: Upper and Left  Prep: chloraprep       Needles:  Injection technique: Single-shot  Needle Type: Stimulator Needle - 40     Needle Length: 4cm  Needle Gauge: 22     Additional Needles:   Procedures:,,,, ultrasound used (permanent image in chart),,    Narrative:  Start time: 05/24/2023 6:52 AM End time: 05/24/2023 7:12 AM Injection made incrementally with aspirations every 5 mL.  Performed by: Personally  Anesthesiologist: Lewie Loron, MD  Additional Notes: BP cuff, SpO2 and EKG monitors applied. Sedation begun. Nerve location verified with ultrasound. Anesthetic injected incrementally, slowly, and after neg aspirations under direct u/s guidance. Good perineural spread. Tolerated well.

## 2023-05-24 NOTE — Anesthesia Procedure Notes (Signed)
Procedure Name: Intubation Date/Time: 05/24/2023 7:31 AM  Performed by: Johnette Abraham, CRNAPre-anesthesia Checklist: Patient identified, Emergency Drugs available, Suction available and Patient being monitored Patient Re-evaluated:Patient Re-evaluated prior to induction Oxygen Delivery Method: Circle System Utilized Preoxygenation: Pre-oxygenation with 100% oxygen Induction Type: IV induction Ventilation: Mask ventilation without difficulty Laryngoscope Size: Mac and 3 Grade View: Grade II Tube type: Oral Tube size: 7.0 mm Number of attempts: 1 Airway Equipment and Method: Stylet and Oral airway Placement Confirmation: ETT inserted through vocal cords under direct vision, positive ETCO2 and breath sounds checked- equal and bilateral Secured at: 22 cm Tube secured with: Tape Dental Injury: Teeth and Oropharynx as per pre-operative assessment

## 2023-05-25 DIAGNOSIS — S42292P Other displaced fracture of upper end of left humerus, subsequent encounter for fracture with malunion: Secondary | ICD-10-CM | POA: Diagnosis not present

## 2023-05-25 NOTE — Progress Notes (Signed)
   05/25/23 2031  BiPAP/CPAP/SIPAP  Reason BIPAP/CPAP not in use Non-compliant (Pt has her own machine wtih her but doesnt want to use it, she said she is ok with O2 only. encouraged her to let RN know if she changes her mind.)
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
## 2023-05-25 NOTE — Progress Notes (Signed)
Subjective: 1 Day Post-Op Procedure(s) (LRB): REVERSE SHOULDER ARTHROPLASTY (Left)  Patient reports pain as mild to moderate.  Denies fever, chills, N/V, CP, SOB. Tolerating POs well.  Admits to flatus. Notes that she won't have a ride to go home until tomorrow.  Objective:   VITALS:  Temp:  [97.6 F (36.4 C)-98 F (36.7 C)] 98 F (36.7 C) (08/10 1008) Pulse Rate:  [84-109] 93 (08/10 1008) Resp:  [17-20] 18 (08/10 1008) BP: (123-152)/(40-83) 126/63 (08/10 1008) SpO2:  [98 %-100 %] 100 % (08/10 1008)  General: WDWN patient in NAD. Psych:  Appropriate mood and affect. Neuro:  A&O x 3, Moving all extremities, sensation intact to light touch HEENT:  EOMs intact Chest:  Even non-labored respirations Skin:  Dressing/sling to L UE C/D/I, no rashes or lesions Extremities: warm/dry, mild edema to L shoulder, no erythema or echymosis.  No lymphadenopathy. Pulses: Radial 2+ MSK:  ROM: full L wrist ROM, Grip strength: 4/5    LABS Recent Labs    05/24/23 1145  HGB 11.5*  WBC 12.0*  PLT 311   Recent Labs    05/24/23 1145  CREATININE 0.50   No results for input(s): "LABPT", "INR" in the last 72 hours.   Assessment/Plan: 1 Day Post-Op Procedure(s) (LRB): REVERSE SHOULDER ARTHROPLASTY (Left)  Patient seen in rounds for Dr. Aundria Rud NWB L UE.  Can do ADLs and elbow exercises out of sling. Up with therapy Plan to D/C home tomorrow when patient has a ride. Plan for outpatient post-op appointment with Dr. Aundria Rud.  Alfredo Martinez PA-C [facility] Office:  [phone]

## 2023-05-26 DIAGNOSIS — S42292P Other displaced fracture of upper end of left humerus, subsequent encounter for fracture with malunion: Secondary | ICD-10-CM | POA: Diagnosis not present

## 2023-05-26 MED ORDER — METHOCARBAMOL 500 MG PO TABS: 500.0000 mg | ORAL_TABLET | Freq: Three times a day (TID) | ORAL | 1 refills | Status: AC | PRN

## 2023-05-26 MED ORDER — DOCUSATE SODIUM 100 MG PO CAPS: 100.0000 mg | ORAL_CAPSULE | Freq: Two times a day (BID) | ORAL | 0 refills | Status: AC

## 2023-05-26 NOTE — Plan of Care (Signed)
Pt ready to DC home when her ride arrives.

## 2023-05-26 NOTE — Progress Notes (Signed)
Patient ID: Melissa Burke, female   DOB: May 08, 1951, 72 y.o.   MRN: [id number] Subjective: 2 Days Post-Op Procedure(s) (LRB): REVERSE SHOULDER ARTHROPLASTY (Left)    Patient reports pain as moderate. Dressed, out of bed and getting ready to go Waiting for ride this afternoon  Objective:   VITALS:   Vitals:   05/25/23 2238 05/26/23 0628  BP: (!) 177/93 (!) 124/57  Pulse: (!) 106 86  Resp: 18 17  Temp: 99.4 F (37.4 C) (!) 97.4 F (36.3 C)  SpO2: 99% 99%    Neurovascular intact Incision: dressing C/D/I - arm in sling Aquacell dry  LABS Recent Labs    05/24/23 1145  HGB 11.5*  HCT 35.4*  WBC 12.0*  PLT 311    Recent Labs    05/24/23 1145  CREATININE 0.50    No results for input(s): "LABPT", "INR" in the last 72 hours.   Assessment/Plan: 2 Days Post-Op Procedure(s) (LRB): REVERSE SHOULDER ARTHROPLASTY (Left)   Plan for her to be discharged home today once her ride is available

## 2023-05-27 NOTE — Discharge Summary (Signed)
Patient ID: Melissa Burke MRN: [id number] DOB/AGE: 03-16-1951 72 y.o.  Admit date: 05/24/2023 Discharge date: 05/26/2023  Primary Diagnosis: Left proximal humerus malunion Admission Diagnoses: S/p left reverse total shoulder arthroplasty Past Medical History:  Diagnosis Date   Asthma    Avascular necrosis (HCC)    Cancer (HCC)    Nasal cancer   Chronic pain syndrome    Chronic respiratory failure (HCC)    Compression fracture of T12 vertebra (HCC)    Hypertension    Hypothyroidism    Osteoarthritis    Osteopenia    PTSD (post-traumatic stress disorder)    Restless leg syndrome    Severe anemia    Sleep apnea    Discharge Diagnoses:   Principal Problem:   S/P reverse total shoulder arthroplasty, left  Estimated body mass index is 24.86 kg/m as calculated from the following:   Height as of this encounter: 5\' 6"  (1.676 m).   Weight as of this encounter: 69.9 kg.  Procedure:  Procedure(s) (LRB): REVERSE SHOULDER ARTHROPLASTY (Left)   Consults: None  HPI: Melissa Burke is  a 72 y.o. female who complains of Left shoulder pain and stiffness.  She was doing pretty well following a proximal humerus fracture but has gone on to develop avascular necrosis and stiffness.  Here today for reverse arthroplasty for definitive treatment after failure conservative measures. She underwent RTSA on 05/24/23 and was admitted for post operative monitoring and OT.   Laboratory Data: Admission on 05/24/2023, Discharged on 05/26/2023  Component Date Value Ref Range Status   WBC 05/24/2023 12.0 (H)  4.0 - 10.5 K/uL Final   RBC 05/24/2023 3.40 (L)  3.87 - 5.11 MIL/uL Final   Hemoglobin 05/24/2023 11.5 (L)  12.0 - 15.0 g/dL Final   HCT 66/44/0347 35.4 (L)  36.0 - 46.0 % Final   MCV 05/24/2023 104.1 (H)  80.0 - 100.0 fL Final   MCH 05/24/2023 33.8  26.0 - 34.0 pg Final   MCHC 05/24/2023 32.5  30.0 - 36.0 g/dL Final   RDW 42/59/5638 12.4  11.5 - 15.5 % Final   Platelets 05/24/2023 311  150 -  400 K/uL Final   nRBC 05/24/2023 0.0  0.0 - 0.2 % Final   Performed at [facility], [address] [place], [place] [address]   Creatinine, Ser 05/24/2023 0.50  0.44 - 1.00 mg/dL Final   GFR, Estimated 05/24/2023 >60  >60 mL/min Final   Comment: (NOTE) Calculated using the CKD-EPI Creatinine Equation (2021) Performed at [facility], [address], [place], [place] [address]   Hospital Outpatient Visit on 05/13/2023  Component Date Value Ref Range Status   MRSA, PCR 05/13/2023 POSITIVE (A)  NEGATIVE Final   Comment: RESULT CALLED TO, READ BACK BY AND VERIFIED WITH: ROSELO, L. RN AT [phone] ON 05/14/2023 BY MECIAL J.    Staphylococcus aureus 05/13/2023 POSITIVE (A)  NEGATIVE Final   Comment: (NOTE) The Xpert SA Assay (FDA approved for NASAL specimens in patients 40 years of age and older), is one component of a comprehensive surveillance program. It is not intended to diagnose infection nor to guide or monitor treatment. Performed at [facility], [address] [place], [place] [address]    Sodium 05/13/2023 135  135 - 145 mmol/L Final   Potassium 05/13/2023 3.6  3.5 - 5.1 mmol/L Final   Chloride 05/13/2023 98  98 - 111 mmol/L Final   CO2 05/13/2023 28  22 - 32 mmol/L Final   Glucose,  Bld 05/13/2023 125 (H)  70 - 99 mg/dL Final   Glucose reference range applies only to samples taken after fasting for at least 8 hours.   BUN 05/13/2023 21  8 - 23 mg/dL Final   Creatinine, Ser 05/13/2023 0.48  0.44 - 1.00 mg/dL Final   Calcium 16/07/9603 9.0  8.9 - 10.3 mg/dL Final   GFR, Estimated 05/13/2023 >60  >60 mL/min Final   Comment: (NOTE) Calculated using the CKD-EPI Creatinine Equation (2021)    Anion gap 05/13/2023 9  5 - 15 Final   Performed at [facility], [address], [place], [place] [address]   WBC 05/13/2023 12.2 (H)  4.0 - 10.5 K/uL Final   RBC 05/13/2023 3.45 (L)  3.87 - 5.11 MIL/uL Final    Hemoglobin 05/13/2023 11.4 (L)  12.0 - 15.0 g/dL Final   HCT 11/91/4782 35.9 (L)  36.0 - 46.0 % Final   MCV 05/13/2023 104.1 (H)  80.0 - 100.0 fL Final   MCH 05/13/2023 33.0  26.0 - 34.0 pg Final   MCHC 05/13/2023 31.8  30.0 - 36.0 g/dL Final   RDW 95/62/1308 13.1  11.5 - 15.5 % Final   Platelets 05/13/2023 343  150 - 400 K/uL Final   nRBC 05/13/2023 0.0  0.0 - 0.2 % Final   Performed at [facility], [address] [place], [place] [address]     X-Rays:DG Shoulder Left Port  Result Date: 05/24/2023 CLINICAL DATA:  Status post reverse total left shoulder arthroplasty. EXAM: LEFT SHOULDER COMPARISON:  CT left shoulder 04/26/2023 FINDINGS: Single frontal view of the left shoulder. Interval reverse total left shoulder arthroplasty. No perihardware lucency is seen to indicate hardware failure or loosening. Normal alignment on limited frontal view. Mild acromioclavicular joint space narrowing and peripheral osteophytosis. Expected postoperative soft tissue swelling and mild subcutaneous air. No acute fracture or dislocation. IMPRESSION: Interval reverse total left shoulder arthroplasty without evidence of hardware failure or loosening. Electronically Signed   By: Neita Garnet M.D.   On: 05/24/2023 12:33    EKG: Orders placed or performed during the hospital encounter of 05/13/23   EKG 12 lead per protocol   EKG 12 lead per protocol     Hospital Course: Melissa Burke is a 72 y.o. who was admitted to Hospital. They were brought to the operating room on 05/24/2023 and underwent Procedure(s): REVERSE SHOULDER ARTHROPLASTY.  Patient tolerated the procedure well and was later transferred to the recovery room and then to the orthopaedic floor for postoperative care.  They were given PO and IV analgesics for pain control following their surgery.  They were given 24 hours of postoperative antibiotics of  Anti-infectives (From admission, onward)    Start     Dose/Rate Route Frequency  Ordered Stop   05/24/23 1400  ceFAZolin (ANCEF) IVPB 1 g/50 mL premix        1 g 100 mL/hr over 30 Minutes Intravenous Every 6 hours 05/24/23 1051 05/25/23 1225   05/24/23 0808  vancomycin (VANCOCIN) powder  Status:  Discontinued          As needed 05/24/23 0808 05/24/23 1036   05/24/23 0600  ceFAZolin (ANCEF) IVPB 2g/100 mL premix        2 g 200 mL/hr over 30 Minutes Intravenous On call to O.R. 05/24/23 6962 05/24/23 0806   05/24/23 0600  vancomycin (VANCOCIN) IVPB 1000 mg/200 mL premix        1,000 mg 200 mL/hr over 60 Minutes Intravenous  On call to O.R. 05/24/23 5784 05/24/23 0720      and started on DVT prophylaxis in the form of Lovenox and  SCDs .   OT was ordered for total joint protocol.  Discharge planning consulted to help with postop disposition and equipment needs.  Patient had an uneventful  night on the evening of surgery.  They started to get up OOB with therapy on day one.  the patient had progressed with therapy and meeting their goals.  Incision was healing well.  Patient was seen in rounds and was ready to go home.   Diet: Regular diet Activity:NWB LUE  Follow-up:in 2 weeks Disposition - Home Discharged Condition: good    Allergies as of 05/26/2023       Reactions   Erythromycin Diarrhea   Other Other (See Comments)   Metal - breakout        Medication List     STOP taking these medications    HYDROcodone-acetaminophen 10-325 MG tablet Commonly known as: NORCO       TAKE these medications    alendronate 70 MG tablet Commonly known as: FOSAMAX Take 70 mg by mouth once a week. Take with a full glass of water on an empty stomach in the morning   budesonide 0.25 MG/2ML nebulizer solution Commonly known as: PULMICORT Take 2 mLs (0.25 mg total) by nebulization 2 (two) times daily. What changed:  when to take this reasons to take this   calcium carbonate 1500 (600 Ca) MG Tabs tablet Commonly known as: OSCAL Take 600 mg of elemental calcium by  mouth daily.   celecoxib 200 MG capsule Commonly known as: CELEBREX Take 200 mg by mouth at bedtime.   chlorhexidine 4 % external liquid Commonly known as: HIBICLENS Apply 15 mLs (1 Application total) topically as directed for 30 doses. Use as directed daily for 5 days every other week for 6 weeks.   cyanocobalamin 1000 MCG tablet Commonly known as: VITAMIN B12 Take 3,000 mcg by mouth in the morning.   docusate sodium 100 MG capsule Commonly known as: COLACE Take 1 capsule (100 mg total) by mouth 2 (two) times daily.   DULoxetine 60 MG capsule Commonly known as: CYMBALTA Take 60 mg by mouth 2 (two) times daily.   furosemide 20 MG tablet Commonly known as: LASIX Take 20 mg by mouth daily as needed for fluid or edema.   Ipratropium-Albuterol 20-100 MCG/ACT Aers respimat Commonly known as: COMBIVENT Take 1 puff by mouth every 6 (six) hours as needed for wheezing or shortness of breath.   levothyroxine 112 MCG tablet Commonly known as: SYNTHROID Take 112 mcg by mouth every Monday, Wednesday, and Friday.   loratadine 10 MG tablet Commonly known as: CLARITIN Take 10 mg by mouth in the morning and at bedtime.   metFORMIN 500 MG tablet Commonly known as: Glucophage Take 1 tablet (500 mg total) by mouth 2 (two) times daily with a meal.   methocarbamol 500 MG tablet Commonly known as: ROBAXIN Take 1 tablet (500 mg total) by mouth every 8 (eight) hours as needed for muscle spasms. What changed:  when to take this reasons to take this   mupirocin ointment 2 % Commonly known as: BACTROBAN Place 1 Application into the nose 2 (two) times daily for 60 doses. Use as directed 2 times daily for 5 days every other week for 6 weeks.   nortriptyline 50 MG capsule Commonly known as: PAMELOR Take 50 mg by mouth at bedtime.   ondansetron  4 MG tablet Commonly known as: Zofran Take 1 tablet (4 mg total) by mouth every 8 (eight) hours as needed for nausea or vomiting.    oxyCODONE-acetaminophen 10-325 MG tablet Commonly known as: Percocet Take 1 tablet by mouth every 6 (six) hours as needed for pain.   traZODone 100 MG tablet Commonly known as: DESYREL Take 300 mg by mouth at bedtime.   Vitamin D3 125 MCG (5000 UT) Caps Take 5,000 Units by mouth in the morning.        Follow-up Information     Yolonda Kida, MD Follow up in 2 week(s).   Specialty: Orthopedic Surgery Why: For wound re-check Contact information: [address] [place] [address] [phone]                 Signed: Dion Saucier PA-C  Orthopaedic Surgery 05/27/2023, 9:38 AM

## 2023-05-28 ENCOUNTER — Encounter (HOSPITAL_COMMUNITY): Payer: Self-pay | Admitting: Orthopedic Surgery
